# Patient Record
Sex: Male | Born: 1945 | Race: White | Hispanic: No | Marital: Married | Smoking: Former smoker
Health system: Southern US, Community
[De-identification: ages and names within clinical notes are randomized; demographics above are authoritative.]

## PROBLEM LIST (undated history)

## (undated) DIAGNOSIS — N138 Other obstructive and reflux uropathy: Secondary | ICD-10-CM

## (undated) DIAGNOSIS — I10 Essential (primary) hypertension: Secondary | ICD-10-CM

## (undated) DIAGNOSIS — A088 Other specified intestinal infections: Secondary | ICD-10-CM

## (undated) DIAGNOSIS — M170 Bilateral primary osteoarthritis of knee: Secondary | ICD-10-CM

## (undated) DIAGNOSIS — N401 Enlarged prostate with lower urinary tract symptoms: Secondary | ICD-10-CM

## (undated) DIAGNOSIS — R7402 Elevation of levels of lactic acid dehydrogenase (LDH): Secondary | ICD-10-CM

## (undated) DIAGNOSIS — I714 Abdominal aortic aneurysm, without rupture, unspecified: Secondary | ICD-10-CM

## (undated) DIAGNOSIS — G4762 Sleep related leg cramps: Secondary | ICD-10-CM

## (undated) DIAGNOSIS — R739 Hyperglycemia, unspecified: Secondary | ICD-10-CM

## (undated) DIAGNOSIS — K52832 Lymphocytic colitis: Secondary | ICD-10-CM

## (undated) DIAGNOSIS — C801 Malignant (primary) neoplasm, unspecified: Secondary | ICD-10-CM

## (undated) DIAGNOSIS — R7303 Prediabetes: Secondary | ICD-10-CM

## (undated) DIAGNOSIS — M1A0720 Idiopathic chronic gout, left ankle and foot, without tophus (tophi): Secondary | ICD-10-CM

## (undated) DIAGNOSIS — C61 Malignant neoplasm of prostate: Secondary | ICD-10-CM

## (undated) DIAGNOSIS — K760 Fatty (change of) liver, not elsewhere classified: Secondary | ICD-10-CM

## (undated) DIAGNOSIS — R300 Dysuria: Secondary | ICD-10-CM

## (undated) DIAGNOSIS — R740 Nonspecific elevation of levels of transaminase and lactic acid dehydrogenase [LDH]: Secondary | ICD-10-CM

## (undated) DIAGNOSIS — R7401 Elevation of levels of liver transaminase levels: Secondary | ICD-10-CM

## (undated) DIAGNOSIS — K5792 Diverticulitis of intestine, part unspecified, without perforation or abscess without bleeding: Secondary | ICD-10-CM

## (undated) DIAGNOSIS — E785 Hyperlipidemia, unspecified: Secondary | ICD-10-CM

## (undated) DIAGNOSIS — N529 Male erectile dysfunction, unspecified: Secondary | ICD-10-CM

## (undated) DIAGNOSIS — M109 Gout, unspecified: Secondary | ICD-10-CM

## (undated) HISTORY — DX: Dysuria: R30.0

## (undated) HISTORY — DX: Lymphocytic colitis: K52.832

## (undated) HISTORY — DX: Elevation of levels of liver transaminase levels: R74.01

## (undated) HISTORY — DX: Diverticulitis of intestine, part unspecified, without perforation or abscess without bleeding: K57.92

## (undated) HISTORY — PX: COLONOSCOPY: SHX174

## (undated) HISTORY — DX: Abdominal aortic aneurysm, without rupture: I71.4

## (undated) HISTORY — DX: Essential (primary) hypertension: I10

## (undated) HISTORY — DX: Benign prostatic hyperplasia with lower urinary tract symptoms: N40.1

## (undated) HISTORY — DX: Hyperlipidemia, unspecified: E78.5

## (undated) HISTORY — DX: Elevation of levels of lactic acid dehydrogenase (LDH): R74.02

## (undated) HISTORY — DX: Other specified intestinal infections: A08.8

## (undated) HISTORY — DX: Prediabetes: R73.03

## (undated) HISTORY — DX: Idiopathic chronic gout, left ankle and foot, without tophus (tophi): M1A.0720

## (undated) HISTORY — DX: Gout, unspecified: M10.9

## (undated) HISTORY — DX: Hyperglycemia, unspecified: R73.9

## (undated) HISTORY — PX: KNEE SURGERY: SHX244

## (undated) HISTORY — DX: Nonspecific elevation of levels of transaminase and lactic acid dehydrogenase (ldh): R74.0

## (undated) HISTORY — DX: Male erectile dysfunction, unspecified: N52.9

## (undated) HISTORY — DX: Bilateral primary osteoarthritis of knee: M17.0

## (undated) HISTORY — DX: Malignant neoplasm of prostate: C61

## (undated) HISTORY — DX: Malignant (primary) neoplasm, unspecified: C80.1

## (undated) HISTORY — PX: VASECTOMY: SHX75

## (undated) HISTORY — DX: Fatty (change of) liver, not elsewhere classified: K76.0

## (undated) HISTORY — PX: TOOTH EXTRACTION: SUR596

## (undated) HISTORY — PX: APPENDECTOMY: SHX54

## (undated) HISTORY — DX: Sleep related leg cramps: G47.62

## (undated) HISTORY — DX: Abdominal aortic aneurysm, without rupture, unspecified: I71.40

## (undated) HISTORY — PX: EYE SURGERY: SHX253

## (undated) HISTORY — DX: Other obstructive and reflux uropathy: N13.8

---

## 2014-08-28 DIAGNOSIS — M25561 Pain in right knee: Secondary | ICD-10-CM | POA: Insufficient documentation

## 2015-08-05 DIAGNOSIS — R739 Hyperglycemia, unspecified: Secondary | ICD-10-CM | POA: Insufficient documentation

## 2015-08-05 DIAGNOSIS — N138 Other obstructive and reflux uropathy: Secondary | ICD-10-CM | POA: Insufficient documentation

## 2015-08-05 DIAGNOSIS — N401 Enlarged prostate with lower urinary tract symptoms: Secondary | ICD-10-CM

## 2015-08-05 DIAGNOSIS — I10 Essential (primary) hypertension: Secondary | ICD-10-CM | POA: Insufficient documentation

## 2015-08-05 DIAGNOSIS — I714 Abdominal aortic aneurysm, without rupture, unspecified: Secondary | ICD-10-CM | POA: Insufficient documentation

## 2015-08-05 DIAGNOSIS — M1A0720 Idiopathic chronic gout, left ankle and foot, without tophus (tophi): Secondary | ICD-10-CM | POA: Insufficient documentation

## 2015-08-05 DIAGNOSIS — K760 Fatty (change of) liver, not elsewhere classified: Secondary | ICD-10-CM | POA: Insufficient documentation

## 2015-08-05 DIAGNOSIS — E782 Mixed hyperlipidemia: Secondary | ICD-10-CM | POA: Insufficient documentation

## 2016-08-11 DIAGNOSIS — K635 Polyp of colon: Secondary | ICD-10-CM | POA: Insufficient documentation

## 2017-03-07 DIAGNOSIS — M25461 Effusion, right knee: Secondary | ICD-10-CM | POA: Insufficient documentation

## 2017-03-07 DIAGNOSIS — M25462 Effusion, left knee: Secondary | ICD-10-CM

## 2017-08-24 DIAGNOSIS — K52832 Lymphocytic colitis: Secondary | ICD-10-CM | POA: Insufficient documentation

## 2017-09-06 ENCOUNTER — Encounter: Payer: Self-pay | Admitting: Surgery

## 2017-10-15 ENCOUNTER — Other Ambulatory Visit: Payer: Self-pay

## 2017-10-15 ENCOUNTER — Ambulatory Visit (INDEPENDENT_AMBULATORY_CARE_PROVIDER_SITE_OTHER): Payer: Medicare Other | Admitting: Surgery

## 2017-10-15 ENCOUNTER — Encounter: Payer: Self-pay | Admitting: Surgery

## 2017-10-15 VITALS — BP 139/71 | HR 75 | Temp 98.5°F | Resp 18 | Ht 69.0 in | Wt 195.0 lb

## 2017-10-15 DIAGNOSIS — I714 Abdominal aortic aneurysm, without rupture, unspecified: Secondary | ICD-10-CM

## 2017-10-15 NOTE — Progress Notes (Signed)
Vascular and Vein Specialist of [facility]  Patient name: Trevor Mason MRN: [id number] DOB: 1946-04-11 Sex: male   REQUESTING PROVIDER:    Dr. Daryel Gerald   REASON FOR CONSULT:    Establish vascular care  HISTORY OF PRESENT ILLNESS:   Trevor Mason is a 72 y.o. male, who is referred today to establish vascular care.  He has been followed for an abdominal aortic aneurysm and [place] for many years.  This was first discovered on MRI when looking for back pain etiologies many many years ago.  His most recent ultrasound revealed a 4.61 cm infrarenal abdominal aortic aneurysm.  He denies any abdominal pain.  He has not had any vascular interventions performed in the past.  The patient is suffering from colitis which required steroids for improvement.  He also has severe knee osteoarthritis and is waiting for knee replacement.  He is a former smoker.  He takes a statin for hypercholesterolemia.  He is on ACE inhibitor for hypertension.  PAST MEDICAL HISTORY    Past Medical History:  Diagnosis Date  . AAA (abdominal aortic aneurysm) ([place])   . BPH with urinary obstruction    Does not need a alpha blocker yet  . Chronic idiopathic gout involving toe of left foot without tophus    On Allopurinol  . Diverticulitis   . Dysuria   . Fatty liver   . Gouty arthropathy   . Hyperglycemia    Prediabetic range A1C  . Hyperlipidemia    Mixed.  On Statin.  Marland Kitchen Hypertension    Essential  . Impotence of organic origin   . Lymphocytic colitis    Currently off treatment.  Followed by GI.  Marland Kitchen Nocturnal leg cramps   . Nonspecific elevation of levels of transaminase and lactic acid dehydrogenase (LDH)   . Other specified intestinal infections   . Prediabetes   . Primary osteoarthritis of both knees      FAMILY HISTORY   History reviewed. No pertinent family history.  SOCIAL HISTORY:   Social History   Socioeconomic History  . Marital status: Married      Spouse name: Not on file  . Number of children: Not on file  . Years of education: Not on file  . Highest education level: Not on file  Occupational History  . Not on file  Social Needs  . Financial resource strain: Not on file  . Food insecurity:    Worry: Not on file    Inability: Not on file  . Transportation needs:    Medical: Not on file    Non-medical: Not on file  Tobacco Use  . Smoking status: Former Smoker    Types: Cigars    Last attempt to quit: 08/25/1998    Years since quitting: 19.1  . Smokeless tobacco: Never Used  Substance and Sexual Activity  . Alcohol use: Not Currently  . Drug use: Never  . Sexual activity: Not Currently  Lifestyle  . Physical activity:    Days per week: Not on file    Minutes per session: Not on file  . Stress: Not on file  Relationships  . Social connections:    Talks on phone: Not on file    Gets together: Not on file    Attends religious service: Not on file    Active member of club or organization: Not on file    Attends meetings of clubs or organizations: Not on file    Relationship status: Not on file  .  Intimate partner violence:    Fear of current or ex partner: Not on file    Emotionally abused: Not on file    Physically abused: Not on file    Forced sexual activity: Not on file  Other Topics Concern  . Not on file  Social History Narrative  . Not on file    ALLERGIES:    No Known Allergies  CURRENT MEDICATIONS:    Current Outpatient Medications  Medication Sig Dispense Refill  . allopurinol (ZYLOPRIM) 300 MG tablet Take 300 mg by mouth daily.    . budesonide (ENTOCORT EC) 3 MG 24 hr capsule Take 9 mg by mouth daily.    Marland Kitchen lisinopril (PRINIVIL,ZESTRIL) 20 MG tablet Take 20 mg by mouth daily.    . meloxicam (MOBIC) 15 MG tablet Take 15 mg by mouth daily.    . simvastatin (ZOCOR) 40 MG tablet Take 40 mg by mouth daily.    Marland Kitchen terazosin (HYTRIN) 1 MG capsule Take 1 mg by mouth at bedtime.    . vitamin B-12  (CYANOCOBALAMIN) 1000 MCG tablet Take 1,000 mcg by mouth daily.    . vitamin E 400 UNIT capsule Take 400 Units by mouth daily.     No current facility-administered medications for this visit.     REVIEW OF SYSTEMS:   [X]  denotes positive finding, [ ]  denotes negative finding Cardiac  Comments:  Chest pain or chest pressure:    Shortness of breath upon exertion:    Short of breath when lying flat:    Irregular heart rhythm:        Vascular    Pain in calf, thigh, or hip brought on by ambulation:    Pain in feet at night that wakes you up from your sleep:     Blood clot in your veins:    Leg swelling:  x       Pulmonary    Oxygen at home:    Productive cough:     Wheezing:         Neurologic    Sudden weakness in arms or legs:     Sudden numbness in arms or legs:     Sudden onset of difficulty speaking or slurred speech:    Temporary loss of vision in one eye:     Problems with dizziness:         Gastrointestinal    Blood in stool:      Vomited blood:         Genitourinary    Burning when urinating:     Blood in urine:        Psychiatric    Major depression:         Hematologic    Bleeding problems:    Problems with blood clotting too easily:        Skin    Rashes or ulcers:        Constitutional    Fever or chills:     PHYSICAL EXAM:   Vitals:   10/15/17 1424  BP: 139/71  Pulse: 75  Resp: 18  Temp: 98.5 F (36.9 C)  TempSrc: Oral  SpO2: 97%  Weight: 195 lb (88.5 kg)  Height: 5\' 9"  (1.753 m)    GENERAL: The patient is a well-nourished male, in no acute distress. The vital signs are documented above. CARDIAC: There is a regular rate and rhythm.  VASCULAR: Palpable bilateral pedal pulses.  No carotid bruits PULMONARY: Nonlabored respirations ABDOMEN: Soft and non-tender.  Pulsatile  mass which is not tender MUSCULOSKELETAL: There are no major deformities or cyanosis. NEUROLOGIC: No focal weakness or paresthesias are detected. SKIN: There are no  ulcers or rashes noted. PSYCHIATRIC: The patient has a normal affect.  STUDIES:   I have reviewed his outside ultrasound which shows an aortic diameter measuring 4.61 cm  ASSESSMENT and PLAN   AAA: The patient has had a significant amount of growth in his aneurysm over the course of 1 year (0.7 cm) he does not meet criteria for repair however I would like to get a CT angiogram to determine the true size of his aneurysm and to see if he is a candidate for endovascular repair.  This is been scheduled for July 2019.  This will be a CT angiogram of the chest abdomen and pelvis.  He will follow-up with me after this is completed.   Annamarie Major, MD Vascular and Vein Specialists of [facility] [phone] Pager [phone]

## 2017-10-20 ENCOUNTER — Encounter: Payer: Self-pay | Admitting: Vascular Surgery

## 2017-10-26 ENCOUNTER — Other Ambulatory Visit: Payer: Self-pay

## 2017-10-26 DIAGNOSIS — I714 Abdominal aortic aneurysm, without rupture, unspecified: Secondary | ICD-10-CM

## 2017-10-26 DIAGNOSIS — I713 Abdominal aortic aneurysm, ruptured, unspecified: Secondary | ICD-10-CM

## 2017-10-29 DIAGNOSIS — R972 Elevated prostate specific antigen [PSA]: Secondary | ICD-10-CM | POA: Insufficient documentation

## 2017-11-19 DIAGNOSIS — C61 Malignant neoplasm of prostate: Secondary | ICD-10-CM | POA: Insufficient documentation

## 2017-11-29 ENCOUNTER — Other Ambulatory Visit: Payer: Self-pay

## 2017-11-29 ENCOUNTER — Ambulatory Visit
Admission: RE | Admit: 2017-11-29 | Discharge: 2017-11-29 | Disposition: A | Discharge status: Home / Self Care | Payer: Medicare Other | Source: Ambulatory Visit | Attending: Surgery | Admitting: Surgery

## 2017-11-29 ENCOUNTER — Encounter: Payer: Self-pay | Admitting: Surgery

## 2017-11-29 ENCOUNTER — Ambulatory Visit (INDEPENDENT_AMBULATORY_CARE_PROVIDER_SITE_OTHER): Payer: Medicare Other | Admitting: Surgery

## 2017-11-29 VITALS — BP 122/76 | HR 66 | Temp 97.9°F | Resp 16 | Ht 69.0 in | Wt 189.0 lb

## 2017-11-29 DIAGNOSIS — I713 Abdominal aortic aneurysm, ruptured, unspecified: Secondary | ICD-10-CM

## 2017-11-29 DIAGNOSIS — I714 Abdominal aortic aneurysm, without rupture, unspecified: Secondary | ICD-10-CM

## 2017-11-29 MED ORDER — IOPAMIDOL (ISOVUE-370) INJECTION 76%
75.0000 mL | Freq: Once | INTRAVENOUS | Status: AC | PRN
  Administered 2017-11-29: 75 mL via INTRAVENOUS

## 2017-11-29 NOTE — Progress Notes (Signed)
Vascular and Vein Specialist of [facility]  Patient name: Trevor Mason MRN: [id number] DOB: [birth year] Sex: male   REASON FOR VISIT:    Follow-up AAA  HISOTRY OF PRESENT ILLNESS:    Trevor Mason is a 72 y.o. male who returns today for further evaluation of his abdominal aortic aneurysm.  This was first discovered on MRI when looking for etiology of back pain many years ago.  It has been followed in [facility].  He has not had any vascular interventions in the past.  The patient takes a statin for hypercholesterolemia.  He is on ACE inhibitor for hypertension.  He is a former smoker.  He does suffer from colitis and osteoarthritis of his knee.  He has recently been diagnosed with prostate cancer and is undergoing work-up for this.  PAST MEDICAL HISTORY:   Past Medical History:  Diagnosis Date  . AAA (abdominal aortic aneurysm) ([place])   . BPH with urinary obstruction    Does not need a alpha blocker yet  . Chronic idiopathic gout involving toe of left foot without tophus    On Allopurinol  . Diverticulitis   . Dysuria   . Fatty liver   . Gouty arthropathy   . Hyperglycemia    Prediabetic range A1C  . Hyperlipidemia    Mixed.  On Statin.  Marland Kitchen Hypertension    Essential  . Impotence of organic origin   . Lymphocytic colitis    Currently off treatment.  Followed by GI.  Marland Kitchen Nocturnal leg cramps   . Nonspecific elevation of levels of transaminase and lactic acid dehydrogenase (LDH)   . Other specified intestinal infections   . Prediabetes   . Primary osteoarthritis of both knees      FAMILY HISTORY:   No family history on file.  SOCIAL HISTORY:   Social History   Tobacco Use  . Smoking status: Former Smoker    Types: Cigars    Last attempt to quit: 08/25/1998    Years since quitting: 19.2  . Smokeless tobacco: Never Used  Substance Use Topics  . Alcohol use: Not Currently     ALLERGIES:   No Known Allergies   CURRENT  MEDICATIONS:   Current Outpatient Medications  Medication Sig Dispense Refill  . allopurinol (ZYLOPRIM) 300 MG tablet Take 300 mg by mouth daily.    . budesonide (ENTOCORT EC) 3 MG 24 hr capsule Take 9 mg by mouth daily.    Marland Kitchen lisinopril (PRINIVIL,ZESTRIL) 20 MG tablet Take 20 mg by mouth daily.    . meloxicam (MOBIC) 15 MG tablet Take 15 mg by mouth daily.    . simvastatin (ZOCOR) 40 MG tablet Take 40 mg by mouth daily.    Marland Kitchen terazosin (HYTRIN) 1 MG capsule Take 1 mg by mouth at bedtime.    . vitamin B-12 (CYANOCOBALAMIN) 1000 MCG tablet Take 1,000 mcg by mouth daily.    . vitamin E 400 UNIT capsule Take 400 Units by mouth daily.     No current facility-administered medications for this visit.     REVIEW OF SYSTEMS:   [X]  denotes positive finding, [ ]  denotes negative finding Cardiac  Comments:  Chest pain or chest pressure:    Shortness of breath upon exertion:    Short of breath when lying flat:    Irregular heart rhythm:        Vascular    Pain in calf, thigh, or hip brought on by ambulation:    Pain in feet at night that  wakes you up from your sleep:     Blood clot in your veins:    Leg swelling:         Pulmonary    Oxygen at home:    Productive cough:     Wheezing:         Neurologic    Sudden weakness in arms or legs:     Sudden numbness in arms or legs:     Sudden onset of difficulty speaking or slurred speech:    Temporary loss of vision in one eye:     Problems with dizziness:         Gastrointestinal    Blood in stool:     Vomited blood:         Genitourinary    Burning when urinating:     Blood in urine:        Psychiatric    Major depression:         Hematologic    Bleeding problems:    Problems with blood clotting too easily:        Skin    Rashes or ulcers:        Constitutional    Fever or chills:      PHYSICAL EXAM:   Vitals:   11/29/17 1132  BP: 122/76  Pulse: 66  Resp: 16  Temp: 97.9 F (36.6 C)  TempSrc: Oral  SpO2: 96%    Weight: 189 lb (85.7 kg)  Height: 5\' 9"  (1.753 m)    GENERAL: The patient is a well-nourished male, in no acute distress. The vital signs are documented above. CARDIAC: There is a regular rate and rhythm.  VASCULAR: No carotid bruits.  Palpable pedal pulses PULMONARY: Non-labored respirations ABDOMEN: Soft and non-tender with normal pitched bowel sounds.  MUSCULOSKELETAL: There are no major deformities or cyanosis. NEUROLOGIC: No focal weakness or paresthesias are detected. SKIN: There are no ulcers or rashes noted. PSYCHIATRIC: The patient has a normal affect.  STUDIES:   I have reviewed his CT angiogram.  It has not been formally read by radiology.  By my measurements the maximum diameter is 4.8 cm  MEDICAL ISSUES:   AAA: I feel that the patient's aneurysm remains less than 5 cm.  Therefore, I would not recommend repair at this time.  In addition, he is undergoing work-up for newly diagnosed prostate cancer.  We have agreed to have him follow-up in 6 months with a repeat CT scan.  When he returns to see me I will also get preoperative carotid duplex as well as lower extremity evaluation.  He understands that he has severe abdominal or back pain to go to the emergency department.    Annamarie Major, MD Vascular and Vein Specialists of [facility] [phone] Pager [phone]

## 2017-12-06 ENCOUNTER — Other Ambulatory Visit (HOSPITAL_COMMUNITY): Payer: Self-pay | Admitting: Urology

## 2017-12-06 ENCOUNTER — Other Ambulatory Visit: Payer: Self-pay

## 2017-12-06 DIAGNOSIS — I739 Peripheral vascular disease, unspecified: Secondary | ICD-10-CM

## 2017-12-06 DIAGNOSIS — I714 Abdominal aortic aneurysm, without rupture, unspecified: Secondary | ICD-10-CM

## 2017-12-06 DIAGNOSIS — C61 Malignant neoplasm of prostate: Secondary | ICD-10-CM

## 2017-12-06 DIAGNOSIS — I6523 Occlusion and stenosis of bilateral carotid arteries: Secondary | ICD-10-CM

## 2017-12-08 ENCOUNTER — Ambulatory Visit (HOSPITAL_COMMUNITY)
Admission: RE | Admit: 2017-12-08 | Discharge: 2017-12-08 | Disposition: A | Discharge status: Home / Self Care | Payer: Medicare Other | Source: Ambulatory Visit | Attending: Urology | Admitting: Urology

## 2017-12-08 DIAGNOSIS — K5730 Diverticulosis of large intestine without perforation or abscess without bleeding: Secondary | ICD-10-CM | POA: Diagnosis not present

## 2017-12-08 DIAGNOSIS — C61 Malignant neoplasm of prostate: Secondary | ICD-10-CM | POA: Insufficient documentation

## 2017-12-08 MED ORDER — GADOBENATE DIMEGLUMINE 529 MG/ML IV SOLN
20.0000 mL | Freq: Once | INTRAVENOUS | Status: AC
  Administered 2017-12-08: 17 mL via INTRAVENOUS

## 2017-12-27 ENCOUNTER — Telehealth: Payer: Self-pay | Admitting: Surgery

## 2017-12-27 NOTE — Telephone Encounter (Signed)
I tried to contact the patient tonight regarding the final read on his CT scan which shows a mesenteric mass.  I left a message for hi to call our office.  I would like for hi to have a general surgery appointment to further evaluate this mass.

## 2018-01-10 ENCOUNTER — Encounter: Payer: Self-pay | Admitting: Surgery

## 2018-01-10 ENCOUNTER — Other Ambulatory Visit: Payer: Self-pay

## 2018-01-10 ENCOUNTER — Ambulatory Visit (INDEPENDENT_AMBULATORY_CARE_PROVIDER_SITE_OTHER): Payer: Medicare Other | Admitting: Surgery

## 2018-01-10 VITALS — BP 140/78 | HR 63 | Resp 20 | Ht 69.0 in | Wt 192.0 lb

## 2018-01-10 DIAGNOSIS — I714 Abdominal aortic aneurysm, without rupture, unspecified: Secondary | ICD-10-CM

## 2018-01-10 DIAGNOSIS — I6523 Occlusion and stenosis of bilateral carotid arteries: Secondary | ICD-10-CM

## 2018-01-10 NOTE — Progress Notes (Signed)
Vascular and Vein Specialist of [facility]  Patient name: Trevor Mason MRN: [id number] DOB: February 13, 1946 Sex: male   REASON FOR VISIT:    Follow up  HISOTRY OF PRESENT ILLNESS:    Trevor Mason is a 72 y.o. male who returns today for discussions of his CT scan.  I last saw him in early July 2019 for a 4.8 cm aneurysm.  He is in the process of getting radiation therapy for prostate cancer.  He was scheduled to follow-up in 6 months with a repeat CT scan.   PAST MEDICAL HISTORY:   Past Medical History:  Diagnosis Date  . AAA (abdominal aortic aneurysm) ([place])   . BPH with urinary obstruction    Does not need a alpha blocker yet  . Chronic idiopathic gout involving toe of left foot without tophus    On Allopurinol  . Diverticulitis   . Dysuria   . Fatty liver   . Gouty arthropathy   . Hyperglycemia    Prediabetic range A1C  . Hyperlipidemia    Mixed.  On Statin.  Marland Kitchen Hypertension    Essential  . Impotence of organic origin   . Lymphocytic colitis    Currently off treatment.  Followed by GI.  Marland Kitchen Nocturnal leg cramps   . Nonspecific elevation of levels of transaminase and lactic acid dehydrogenase (LDH)   . Other specified intestinal infections   . Prediabetes   . Primary osteoarthritis of both knees      FAMILY HISTORY:   History reviewed. No pertinent family history.  SOCIAL HISTORY:   Social History   Tobacco Use  . Smoking status: Former Smoker    Types: Cigars    Last attempt to quit: 08/25/1998    Years since quitting: 19.3  . Smokeless tobacco: Never Used  Substance Use Topics  . Alcohol use: Not Currently     ALLERGIES:   No Known Allergies   CURRENT MEDICATIONS:   Current Outpatient Medications  Medication Sig Dispense Refill  . allopurinol (ZYLOPRIM) 300 MG tablet Take 300 mg by mouth daily.    . budesonide (ENTOCORT EC) 3 MG 24 hr capsule Take 9 mg by mouth daily.    Marland Kitchen lisinopril (PRINIVIL,ZESTRIL) 20 MG  tablet Take 20 mg by mouth daily.    . meloxicam (MOBIC) 15 MG tablet Take 15 mg by mouth daily.    . simvastatin (ZOCOR) 40 MG tablet Take 40 mg by mouth daily.    Marland Kitchen terazosin (HYTRIN) 1 MG capsule Take 1 mg by mouth at bedtime.    . vitamin B-12 (CYANOCOBALAMIN) 1000 MCG tablet Take 1,000 mcg by mouth daily.    . vitamin E 400 UNIT capsule Take 400 Units by mouth daily.     No current facility-administered medications for this visit.     REVIEW OF SYSTEMS:   [X]  denotes positive finding, [ ]  denotes negative finding Cardiac  Comments:  Chest pain or chest pressure:    Shortness of breath upon exertion:    Short of breath when lying flat:    Irregular heart rhythm:        Vascular    Pain in calf, thigh, or hip brought on by ambulation:    Pain in feet at night that wakes you up from your sleep:     Blood clot in your veins:    Leg swelling:         Pulmonary    Oxygen at home:    Productive cough:  Wheezing:         Neurologic    Sudden weakness in arms or legs:     Sudden numbness in arms or legs:     Sudden onset of difficulty speaking or slurred speech:    Temporary loss of vision in one eye:     Problems with dizziness:         Gastrointestinal    Blood in stool:     Vomited blood:         Genitourinary    Burning when urinating:     Blood in urine:        Psychiatric    Major depression:         Hematologic    Bleeding problems:    Problems with blood clotting too easily:        Skin    Rashes or ulcers:        Constitutional    Fever or chills:      PHYSICAL EXAM:   Vitals:   01/10/18 0829  BP: 140/78  Pulse: 63  Resp: 20  SpO2: 98%  Weight: 192 lb (87.1 kg)  Height: 5\' 9"  (1.753 m)    GENERAL: The patient is a well-nourished male, in no acute distress. The vital signs are documented above. CARDIAC: There is a regular rate and rhythm.  PULMONARY: Non-labored respirations ABDOMEN: Soft and non-tender MUSCULOSKELETAL: There are no  major deformities or cyanosis. NEUROLOGIC: No focal weakness or paresthesias are detected. SKIN: There are no ulcers or rashes noted. PSYCHIATRIC: The patient has a normal affect.  STUDIES:   Vascular:  Infrarenal abdominal aortic aneurysm with a maximal diameter of 4.8 cm.  There are 3 left renal arteries.  No evidence of thoracic aortic aneurysm. No acute vascular pathology in the thorax.  Nonvascular:  T9 compression fracture of indeterminate age. This has a subacute appearance. There is erosion of the superior endplate and pathologic fracture is not excluded.  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 Differential diagnosis includes desmoid tumor, carcinoid tumor, or malignancy. There are associated small satellite nodules or small lymph nodes.  MEDICAL ISSUES:   AAA: Follow-up 6 months with repeat CT scan  Mesenteric mass: Patient was given a copy of the CT scan report which he is going to take to his radiologist/urologist to determine the next step for work-up.  He is scheduled to see them in a few days.    Annamarie Major, MD Vascular and Vein Specialists of [facility] [phone] Pager [phone]

## 2018-01-30 HISTORY — PX: EXCISION OF ABDOMINAL WALL TUMOR: SHX6687

## 2018-02-01 DIAGNOSIS — M542 Cervicalgia: Secondary | ICD-10-CM | POA: Insufficient documentation

## 2018-04-06 ENCOUNTER — Other Ambulatory Visit: Payer: Self-pay

## 2018-06-02 ENCOUNTER — Ambulatory Visit
Admission: RE | Admit: 2018-06-02 | Discharge: 2018-06-02 | Disposition: A | Discharge status: Home / Self Care | Payer: Medicare Other | Source: Ambulatory Visit | Attending: Surgery | Admitting: Surgery

## 2018-06-02 DIAGNOSIS — I714 Abdominal aortic aneurysm, without rupture, unspecified: Secondary | ICD-10-CM

## 2018-06-02 MED ORDER — IOPAMIDOL (ISOVUE-370) INJECTION 76%
75.0000 mL | Freq: Once | INTRAVENOUS | Status: AC | PRN
  Administered 2018-06-02: 75 mL via INTRAVENOUS

## 2018-06-06 ENCOUNTER — Encounter: Payer: Self-pay | Admitting: Surgery

## 2018-06-06 ENCOUNTER — Ambulatory Visit (INDEPENDENT_AMBULATORY_CARE_PROVIDER_SITE_OTHER)
Admission: RE | Admit: 2018-06-06 | Discharge: 2018-06-06 | Disposition: A | Discharge status: Home / Self Care | Payer: Medicare Other | Source: Ambulatory Visit | Attending: Surgery | Admitting: Surgery

## 2018-06-06 ENCOUNTER — Other Ambulatory Visit: Payer: Self-pay

## 2018-06-06 ENCOUNTER — Ambulatory Visit (HOSPITAL_COMMUNITY)
Admission: RE | Admit: 2018-06-06 | Discharge: 2018-06-06 | Disposition: A | Discharge status: Home / Self Care | Payer: Medicare Other | Source: Ambulatory Visit | Attending: Surgery | Admitting: Surgery

## 2018-06-06 ENCOUNTER — Ambulatory Visit (INDEPENDENT_AMBULATORY_CARE_PROVIDER_SITE_OTHER): Payer: Medicare Other | Admitting: Surgery

## 2018-06-06 VITALS — BP 121/76 | HR 68 | Temp 98.0°F | Resp 20 | Ht 69.0 in | Wt 192.0 lb

## 2018-06-06 DIAGNOSIS — I714 Abdominal aortic aneurysm, without rupture, unspecified: Secondary | ICD-10-CM

## 2018-06-06 DIAGNOSIS — I739 Peripheral vascular disease, unspecified: Secondary | ICD-10-CM | POA: Diagnosis not present

## 2018-06-06 DIAGNOSIS — I6523 Occlusion and stenosis of bilateral carotid arteries: Secondary | ICD-10-CM | POA: Diagnosis not present

## 2018-06-06 NOTE — Progress Notes (Signed)
Vascular and Vein Specialist of [facility]  Patient name: Trevor Mason MRN: [id number] DOB: [birth year] Sex: male   REASON FOR VISIT:    Follow up AAA  HISOTRY OF PRESENT ILLNESS:   Trevor Mason is a 73 y.o. male who returns today for further evaluation of his abdominal aortic aneurysm.  This was first discovered on MRI when looking for etiology of back pain many years ago.  It has been [facility] in [facility].  He has not had any vascular interventions in the past.  The patient takes a statin for hypercholesterolemia.  He is on ACE inhibitor for hypertension.  He is a former smoker.  He does suffer from colitis and osteoarthritis of his knee.  He has recently completed radiation therapy for prostate cancer.  Also since her last visit he underwent partial bowel resection which turned out benign.    PAST MEDICAL HISTORY:   Past Medical History:  Diagnosis Date  . AAA (abdominal aortic aneurysm) ([place])   . BPH with urinary obstruction    Does not need a alpha blocker yet  . Chronic idiopathic gout involving toe of left foot without tophus    On Allopurinol  . Diverticulitis   . Dysuria   . Fatty liver   . Gouty arthropathy   . Hyperglycemia    Prediabetic range A1C  . Hyperlipidemia    Mixed.  On Statin.  Marland Kitchen Hypertension    Essential  . Impotence of organic origin   . Lymphocytic colitis    Currently off treatment.  Followed by GI.  Marland Kitchen Nocturnal leg cramps   . Nonspecific elevation of levels of transaminase and lactic acid dehydrogenase (LDH)   . Other specified intestinal infections   . Prediabetes   . Primary osteoarthritis of both knees      FAMILY HISTORY:   History reviewed. No pertinent family history.  SOCIAL HISTORY:   Social History   Tobacco Use  . Smoking status: Former Smoker    Types: Cigars    Last attempt to quit: 08/25/1998    Years since quitting: 19.7  . Smokeless tobacco: Never Used  Substance Use  Topics  . Alcohol use: Not Currently     ALLERGIES:   No Known Allergies   CURRENT MEDICATIONS:   Current Outpatient Medications  Medication Sig Dispense Refill  . allopurinol (ZYLOPRIM) 300 MG tablet Take 300 mg by mouth daily.    . budesonide (ENTOCORT EC) 3 MG 24 hr capsule Take 9 mg by mouth daily.    Marland Kitchen lisinopril (PRINIVIL,ZESTRIL) 20 MG tablet Take 20 mg by mouth daily.    . meloxicam (MOBIC) 15 MG tablet Take 15 mg by mouth daily.    . simvastatin (ZOCOR) 40 MG tablet Take 40 mg by mouth daily.    Marland Kitchen terazosin (HYTRIN) 1 MG capsule Take 1 mg by mouth at bedtime.    . vitamin B-12 (CYANOCOBALAMIN) 1000 MCG tablet Take 1,000 mcg by mouth daily.    . vitamin E 400 UNIT capsule Take 400 Units by mouth daily.     No current facility-administered medications for this visit.     REVIEW OF SYSTEMS:   [X]  denotes positive finding, [ ]  denotes negative finding Cardiac  Comments:  Chest pain or chest pressure:    Shortness of breath upon exertion:    Short of breath when lying flat:    Irregular heart rhythm:        Vascular    Pain in calf, thigh, or hip  brought on by ambulation:    Pain in feet at night that wakes you up from your sleep:     Blood clot in your veins:    Leg swelling:         Pulmonary    Oxygen at home:    Productive cough:     Wheezing:         Neurologic    Sudden weakness in arms or legs:     Sudden numbness in arms or legs:     Sudden onset of difficulty speaking or slurred speech:    Temporary loss of vision in one eye:     Problems with dizziness:         Gastrointestinal    Blood in stool:     Vomited blood:         Genitourinary    Burning when urinating:     Blood in urine:        Psychiatric    Major depression:         Hematologic    Bleeding problems:    Problems with blood clotting too easily:        Skin    Rashes or ulcers:        Constitutional    Fever or chills:      PHYSICAL EXAM:   Vitals:   06/06/18 1034    BP: 121/76  Pulse: 68  Resp: 20  Temp: 98 F (36.7 C)  SpO2: 99%  Weight: 192 lb (87.1 kg)  Height: 5\' 9"  (1.753 m)    GENERAL: The patient is a well-nourished male, in no acute distress. The vital signs are documented above. CARDIAC: There is a regular rate and rhythm.  VASCULAR: no bruits PULMONARY: Non-labored respirations ABDOMEN: Soft and non-tender  MUSCULOSKELETAL: There are no major deformities or cyanosis. NEUROLOGIC: No focal weakness or paresthesias are detected. SKIN: There are no ulcers or rashes noted. PSYCHIATRIC: The patient has a normal affect.  STUDIES:   I have reviewed his CTA with the following findings:  Redemonstration of infrarenal abdominal aortic aneurysm which is essentially unchanged from the most recent CT, measuring approximately 4.9 cm.  VAscular lab studies: Carotid duplex: Right= 1-39%, left= 40-59%  ABI Findings: +---------+------------------+-----+---------+--------+ Right    Rt Pressure (mmHg)IndexWaveform Comment  +---------+------------------+-----+---------+--------+ Brachial 126                                      +---------+------------------+-----+---------+--------+ ATA      255               1.96 triphasic         +---------+------------------+-----+---------+--------+ PTA      255               1.96 triphasic         +---------+------------------+-----+---------+--------+ Great Toe100               0.77                   +---------+------------------+-----+---------+--------+  +---------+------------------+-----+---------+-------+ Left     Lt Pressure (mmHg)IndexWaveform Comment +---------+------------------+-----+---------+-------+ Brachial 130                                     +---------+------------------+-----+---------+-------+ ATA      255  1.96 triphasic        +---------+------------------+-----+---------+-------+ PTA      255               1.96  triphasic        +---------+------------------+-----+---------+-------+ Great Toe121               0.93                  +---------+------------------+-----+---------+-------+  Lower extremity duplex: Right: Normal examination. No evidence of arterial occlusive disease. No obvious common femoral or popliteal artery aneurysm. Irregular, heterogenous plaque is seen on the common femoral arteries bilaterally.  Left: Normal examination. No evidence of arterial occlusive disease. No obvious common femoral or popliteal artery aneurysm. Irregular, heterogenous plaque is seen on the common femoral arteries bilaterally.   MEDICAL ISSUES:   AAA: We discussed proceeding with repair prior to his moving to [place] which is scheduled for April or May.  I am going to give him another 4 to 6 weeks to recover from his radiation therapy and a chance to see his urologist.  He will follow-up with me in 4 to 6 weeks as a preoperative visit to discuss endovascular repair of his aneurysm.    Annamarie Major, MD Vascular and Vein Specialists of [facility] [phone] Pager [phone]

## 2018-07-02 DIAGNOSIS — M7542 Impingement syndrome of left shoulder: Secondary | ICD-10-CM | POA: Insufficient documentation

## 2018-07-04 ENCOUNTER — Encounter: Payer: Self-pay | Admitting: *Deleted

## 2018-07-04 ENCOUNTER — Other Ambulatory Visit: Payer: Self-pay | Admitting: *Deleted

## 2018-07-04 ENCOUNTER — Other Ambulatory Visit: Payer: Self-pay

## 2018-07-04 ENCOUNTER — Encounter: Payer: Self-pay | Admitting: Surgery

## 2018-07-04 ENCOUNTER — Ambulatory Visit (INDEPENDENT_AMBULATORY_CARE_PROVIDER_SITE_OTHER): Payer: Medicare Other | Admitting: Surgery

## 2018-07-04 VITALS — BP 143/86 | HR 65 | Temp 97.4°F | Resp 18 | Ht 69.0 in | Wt 178.0 lb

## 2018-07-04 DIAGNOSIS — I6523 Occlusion and stenosis of bilateral carotid arteries: Secondary | ICD-10-CM

## 2018-07-04 DIAGNOSIS — I714 Abdominal aortic aneurysm, without rupture, unspecified: Secondary | ICD-10-CM

## 2018-07-04 NOTE — Progress Notes (Signed)
Vascular and Vein Specialist of [facility]  Patient name: Trevor Mason MRN: [id number] DOB: 07-Aug-1945 Sex: male   REASON FOR VISIT:    Follow up  HISOTRY OF PRESENT ILLNESS:    Trevor Mason a 73 y.o.malewho returns today for further evaluation of his abdominal aortic aneurysm. This was first discovered on MRI when looking for etiology of back pain many years ago. It has been followed in [facility]. He has not had any vascular interventions in the past.  The patient takes a statin for hypercholesterolemia. He is on ACE inhibitor for hypertension. He is a former smoker. He does suffer from colitis and osteoarthritis of his knee.  He is recently completed radiation therapy for prostate cancer and has also undergone a partial bowel resection for benign etiology  PAST MEDICAL HISTORY:   Past Medical History:  Diagnosis Date  . AAA (abdominal aortic aneurysm) ([place])   . BPH with urinary obstruction    Does not need a alpha blocker yet  . Chronic idiopathic gout involving toe of left foot without tophus    On Allopurinol  . Diverticulitis   . Dysuria   . Fatty liver   . Gouty arthropathy   . Hyperglycemia    Prediabetic range A1C  . Hyperlipidemia    Mixed.  On Statin.  Marland Kitchen Hypertension    Essential  . Impotence of organic origin   . Lymphocytic colitis    Currently off treatment.  Followed by GI.  Marland Kitchen Nocturnal leg cramps   . Nonspecific elevation of levels of transaminase and lactic acid dehydrogenase (LDH)   . Other specified intestinal infections   . Prediabetes   . Primary osteoarthritis of both knees      FAMILY HISTORY:   History reviewed. No pertinent family history.  SOCIAL HISTORY:   Social History   Tobacco Use  . Smoking status: Former Smoker    Types: Cigars    Last attempt to quit: 08/25/1998    Years since quitting: 19.8  . Smokeless tobacco: Never Used  Substance Use Topics  . Alcohol use: Not  Currently     ALLERGIES:   No Known Allergies   CURRENT MEDICATIONS:   Current Outpatient Medications  Medication Sig Dispense Refill  . allopurinol (ZYLOPRIM) 300 MG tablet Take 300 mg by mouth daily.    . budesonide (ENTOCORT EC) 3 MG 24 hr capsule Take 9 mg by mouth daily.    Marland Kitchen lisinopril (PRINIVIL,ZESTRIL) 20 MG tablet Take 20 mg by mouth daily.    . meloxicam (MOBIC) 15 MG tablet Take 15 mg by mouth daily.    . simvastatin (ZOCOR) 40 MG tablet Take 40 mg by mouth daily.    Marland Kitchen terazosin (HYTRIN) 1 MG capsule Take 1 mg by mouth at bedtime.    . vitamin B-12 (CYANOCOBALAMIN) 1000 MCG tablet Take 1,000 mcg by mouth daily.    . vitamin E 400 UNIT capsule Take 400 Units by mouth daily.     No current facility-administered medications for this visit.     REVIEW OF SYSTEMS:   [X]  denotes positive finding, [ ]  denotes negative finding Cardiac  Comments:  Chest pain or chest pressure:    Shortness of breath upon exertion:    Short of breath when lying flat:    Irregular heart rhythm:        Vascular    Pain in calf, thigh, or hip brought on by ambulation:    Pain in feet at night that wakes you  up from your sleep:     Blood clot in your veins:    Leg swelling:         Pulmonary    Oxygen at home:    Productive cough:     Wheezing:         Neurologic    Sudden weakness in arms or legs:     Sudden numbness in arms or legs:     Sudden onset of difficulty speaking or slurred speech:    Temporary loss of vision in one eye:     Problems with dizziness:         Gastrointestinal    Blood in stool:     Vomited blood:         Genitourinary    Burning when urinating:     Blood in urine:        Psychiatric    Major depression:         Hematologic    Bleeding problems:    Problems with blood clotting too easily:        Skin    Rashes or ulcers:        Constitutional    Fever or chills:      PHYSICAL EXAM:   Vitals:   07/04/18 1121  BP: (!) 143/86  Pulse: 65   Resp: 18  Temp: (!) 97.4 F (36.3 C)  TempSrc: Oral  SpO2: 98%  Weight: 178 lb (80.7 kg)  Height: 5\' 9"  (1.753 m)    GENERAL: The patient is a well-nourished male, in no acute distress. The vital signs are documented above. CARDIAC: There is a regular rate and rhythm.  VASCULAR: palpable pedal pulses PULMONARY: Non-labored respirations ABDOMEN: Soft and non-tender  no major deformities or cyanosis. NEUROLOGIC: No focal weakness or paresthesias are detected. SKIN: There are no ulcers or rashes noted. PSYCHIATRIC: The patient has a normal affect.  STUDIES:   none  MEDICAL ISSUES:   AAA:  5.0 cm infrarenal AAA.  We discussed the details of the proceedure including the risks and benefits which include but are not limited to the risk of infection, bleeding, distal embolization, intestinal ischemia, bleeding, death.  All of his questions were answered.  Have placed on the OR schedule for Friday, February 14.  I am sending him for cardiac clearance.  He will start taking a baby aspirin.    Annamarie Major, MD Vascular and Vein Specialists of [facility] [phone] Pager [phone]

## 2018-07-04 NOTE — H&P (View-Only) (Signed)
Vascular and Vein Specialist of [facility]  Patient name: Trevor Mason MRN: [id number] DOB: [birth year] Sex: male   REASON FOR VISIT:    Follow up  HISOTRY OF PRESENT ILLNESS:    Trevor Mason a 73 y.o.malewho returns today for further evaluation of his abdominal aortic aneurysm. This was first discovered on MRI when looking for etiology of back pain many years ago. It has been followed in [facility]. He has not had any vascular interventions in the past.  The patient takes a statin for hypercholesterolemia. He is on ACE inhibitor for hypertension. He is a former smoker. He does suffer from colitis and osteoarthritis of his knee.  He is recently completed radiation therapy for prostate cancer and has also undergone a partial bowel resection for benign etiology  PAST MEDICAL HISTORY:   Past Medical History:  Diagnosis Date  . AAA (abdominal aortic aneurysm) ([place])   . BPH with urinary obstruction    Does not need a alpha blocker yet  . Chronic idiopathic gout involving toe of left foot without tophus    On Allopurinol  . Diverticulitis   . Dysuria   . Fatty liver   . Gouty arthropathy   . Hyperglycemia    Prediabetic range A1C  . Hyperlipidemia    Mixed.  On Statin.  Marland Kitchen Hypertension    Essential  . Impotence of organic origin   . Lymphocytic colitis    Currently off treatment.  Followed by GI.  Marland Kitchen Nocturnal leg cramps   . Nonspecific elevation of levels of transaminase and lactic acid dehydrogenase (LDH)   . Other specified intestinal infections   . Prediabetes   . Primary osteoarthritis of both knees      FAMILY HISTORY:   History reviewed. No pertinent family history.  SOCIAL HISTORY:   Social History   Tobacco Use  . Smoking status: Former Smoker    Types: Cigars    Last attempt to quit: 08/25/1998    Years since quitting: 19.8  . Smokeless tobacco: Never Used  Substance Use Topics  . Alcohol use: Not  Currently     ALLERGIES:   No Known Allergies   CURRENT MEDICATIONS:   Current Outpatient Medications  Medication Sig Dispense Refill  . allopurinol (ZYLOPRIM) 300 MG tablet Take 300 mg by mouth daily.    . budesonide (ENTOCORT EC) 3 MG 24 hr capsule Take 9 mg by mouth daily.    Marland Kitchen lisinopril (PRINIVIL,ZESTRIL) 20 MG tablet Take 20 mg by mouth daily.    . meloxicam (MOBIC) 15 MG tablet Take 15 mg by mouth daily.    . simvastatin (ZOCOR) 40 MG tablet Take 40 mg by mouth daily.    Marland Kitchen terazosin (HYTRIN) 1 MG capsule Take 1 mg by mouth at bedtime.    . vitamin B-12 (CYANOCOBALAMIN) 1000 MCG tablet Take 1,000 mcg by mouth daily.    . vitamin E 400 UNIT capsule Take 400 Units by mouth daily.     No current facility-administered medications for this visit.     REVIEW OF SYSTEMS:   [X]  denotes positive finding, [ ]  denotes negative finding Cardiac  Comments:  Chest pain or chest pressure:    Shortness of breath upon exertion:    Short of breath when lying flat:    Irregular heart rhythm:        Vascular    Pain in calf, thigh, or hip brought on by ambulation:    Pain in feet at night that wakes you  up from your sleep:     Blood clot in your veins:    Leg swelling:         Pulmonary    Oxygen at home:    Productive cough:     Wheezing:         Neurologic    Sudden weakness in arms or legs:     Sudden numbness in arms or legs:     Sudden onset of difficulty speaking or slurred speech:    Temporary loss of vision in one eye:     Problems with dizziness:         Gastrointestinal    Blood in stool:     Vomited blood:         Genitourinary    Burning when urinating:     Blood in urine:        Psychiatric    Major depression:         Hematologic    Bleeding problems:    Problems with blood clotting too easily:        Skin    Rashes or ulcers:        Constitutional    Fever or chills:      PHYSICAL EXAM:   Vitals:   07/04/18 1121  BP: (!) 143/86  Pulse: 65   Resp: 18  Temp: (!) 97.4 F (36.3 C)  TempSrc: Oral  SpO2: 98%  Weight: 178 lb (80.7 kg)  Height: 5\' 9"  (1.753 m)    GENERAL: The patient is a well-nourished male, in no acute distress. The vital signs are documented above. CARDIAC: There is a regular rate and rhythm.  VASCULAR: palpable pedal pulses PULMONARY: Non-labored respirations ABDOMEN: Soft and non-tender  no major deformities or cyanosis. NEUROLOGIC: No focal weakness or paresthesias are detected. SKIN: There are no ulcers or rashes noted. PSYCHIATRIC: The patient has a normal affect.  STUDIES:   none  MEDICAL ISSUES:   AAA:  5.0 cm infrarenal AAA.  We discussed the details of the proceedure including the risks and benefits which include but are not limited to the risk of infection, bleeding, distal embolization, intestinal ischemia, bleeding, death.  All of his questions were answered.  Have placed on the OR schedule for Friday, February 14.  I am sending him for cardiac clearance.  He will start taking a baby aspirin.    Annamarie Major, MD Vascular and Vein Specialists of [facility] [phone] Pager [phone]

## 2018-07-06 ENCOUNTER — Ambulatory Visit (INDEPENDENT_AMBULATORY_CARE_PROVIDER_SITE_OTHER): Payer: Medicare Other | Admitting: Cardiology

## 2018-07-06 ENCOUNTER — Encounter: Payer: Self-pay | Admitting: Cardiology

## 2018-07-06 VITALS — BP 116/72 | HR 63 | Ht 69.0 in | Wt 181.0 lb

## 2018-07-06 DIAGNOSIS — E782 Mixed hyperlipidemia: Secondary | ICD-10-CM

## 2018-07-06 DIAGNOSIS — I6523 Occlusion and stenosis of bilateral carotid arteries: Secondary | ICD-10-CM | POA: Insufficient documentation

## 2018-07-06 DIAGNOSIS — I714 Abdominal aortic aneurysm, without rupture, unspecified: Secondary | ICD-10-CM

## 2018-07-06 DIAGNOSIS — I10 Essential (primary) hypertension: Secondary | ICD-10-CM

## 2018-07-06 DIAGNOSIS — Z01810 Encounter for preprocedural cardiovascular examination: Secondary | ICD-10-CM | POA: Insufficient documentation

## 2018-07-06 NOTE — Progress Notes (Signed)
Subjective:   @Patient  ID@: Trevor Mason, male    DOB: 07-07-1945, 73 y.o.   MRN: [id number]  Patient referred by Serafina Mitchell for preoperative cardiac risk stratificaiton.  Chief Complaint  Patient presents with  . New Patient (Initial Visit)    For Pre-Op clearance on 07/15/18    HPI   Patient with hypertension, hyperlipidemia, osteoarthritis, former tobacco use, fatty liver previously followed by GI, prostate cancer diagnosed in 2019 S/P radiation therapy and is now in remission, known infrarenal 5.0 cm abdominal aortic aneurysm measuring eferred to [place] for preoperative cardiac risk stratification by Dr. Annamarie Major.   Patient reports was diagnosed 20 years ago with aneurysm and has progressively enlarged over the last year. He denies any history of CAD.   He was working until August 2019 after having colonic tumor removal. He is planning on moving later this year. 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 He does admit to not doing any strenuous exercise after his surgery and also due to bilateral knee pain from arthritis. He does walk approximatley 5 mins on the treadmill. No chest pain or dyspnea on exertion.   Past Medical History:  Diagnosis Date  . AAA (abdominal aortic aneurysm) ([place])   . AAA (abdominal aortic aneurysm) ([place])   . BPH with urinary obstruction    Does not need a alpha blocker yet  . Cancer ([place])   . Chronic idiopathic gout involving toe of left foot without tophus    On Allopurinol  . Diverticulitis   . Dysuria   . Fatty liver   . Gouty arthropathy   . Hyperglycemia    Prediabetic range A1C  . Hyperlipidemia    Mixed.  On Statin.  Marland Kitchen Hypertension    Essential  . Impotence of organic origin   . Lymphocytic colitis    Currently off treatment.  Followed by GI.  Marland Kitchen Nocturnal leg cramps   . Nonspecific elevation of levels of transaminase and lactic acid dehydrogenase (LDH)   . Other specified intestinal  infections   . Prediabetes   . Primary osteoarthritis of both knees   . Prostate cancer [facility])     Past Surgical History:  Procedure Laterality Date  . COLONOSCOPY    . 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 EYE SURGERY    . KNEE SURGERY Bilateral   . TOOTH EXTRACTION    . VASECTOMY      Family History  Problem Relation Age of Onset  . Hypertension Mother   . Stroke Mother   . Brain cancer Father   . Hypertension Sister   . COPD Brother   . Diabetic kidney disease Sister     Social History   Socioeconomic History  . Marital status: Married    Spouse name: Not on file  . Number of children: 1  . Years of education: Not on file  . Highest education level: Not on file  Occupational History  . Not on file  Social Needs  . Financial resource strain: Not on file  . Food insecurity:    Worry: Not on file    Inability: Not on file  . Transportation needs:    Medical: Not on file    Non-medical: Not on file  Tobacco Use  . Smoking status: Former Smoker    Packs/day: 2.00    Years: 40.00    Pack years: 80.00  Types: Cigarettes    Last attempt to quit: 08/25/1998    Years since quitting: 19.8  . Smokeless tobacco: Never Used  Substance and Sexual Activity  . Alcohol use: Yes    Comment: couple drinks a week  . Drug use: Never  . Sexual activity: Not Currently  Lifestyle  . Physical activity:    Days per week: Not on file    Minutes per session: Not on file  . Stress: Not on file  Relationships  . Social connections:    Talks on phone: Not on file    Gets together: Not on file    Attends religious service: Not on file    Active member of club or organization: Not on file    Attends meetings of clubs or organizations: Not on file    Relationship status: Not on file  . Intimate partner violence:    Fear of current or ex partner: Not on file    Emotionally abused: Not on file    Physically abused: Not on file    Forced sexual  activity: Not on file  Other Topics Concern  . Not on file  Social History Narrative  . Not on file    Current Outpatient Medications on File Prior to Visit  Medication Sig Dispense Refill  . acetaminophen (TYLENOL) 650 MG CR tablet Take 1,300 mg by mouth every 8 (eight) hours as needed for pain.    Marland Kitchen allopurinol (ZYLOPRIM) 300 MG tablet Take 300 mg by mouth every evening.     Marland Kitchen aspirin EC 81 MG tablet Take 81 mg by mouth daily.    . budesonide (ENTOCORT EC) 3 MG 24 hr capsule Take 3 mg by mouth daily.     . Carboxymethylcellulose Sodium (LUBRICANT EYE DROPS OP) Place 1 drop into both eyes daily.    Marland Kitchen lisinopril (PRINIVIL,ZESTRIL) 20 MG tablet Take 20 mg by mouth daily.    . simvastatin (ZOCOR) 40 MG tablet Take 40 mg by mouth every evening.     . terazosin (HYTRIN) 1 MG capsule Take 1 mg by mouth at bedtime.    . vitamin B-12 (CYANOCOBALAMIN) 1000 MCG tablet Take 1,000 mcg by mouth daily.    . vitamin E 400 UNIT capsule Take 800 Units by mouth every evening.      No current facility-administered medications on file prior to visit.      Review of Systems  Constitution: Positive for decreased appetite (since radiation therapy). Negative for malaise/fatigue, weight gain and weight loss.  Eyes: Negative for visual disturbance.  Cardiovascular: Negative for chest pain, claudication, dyspnea on exertion, leg swelling and syncope.  Respiratory: Negative for hemoptysis and wheezing.   Endocrine: Negative for cold intolerance and heat intolerance.  Hematologic/Lymphatic: Negative for bleeding problem. Does not bruise/bleed easily.  Skin: Negative for nail changes.  Musculoskeletal: Positive for arthritis (bilateral knees). Negative for myalgias.  Gastrointestinal: Negative for abdominal pain, nausea and vomiting.  Neurological: Negative for difficulty with concentration, dizziness, focal weakness and headaches.  Psychiatric/Behavioral: Negative for altered mental status and suicidal ideas.   All other systems reviewed and are negative.      Objective:   Physical Exam  Constitutional: He is oriented to person, place, and time. He appears well-developed and well-nourished.  HENT:  Head: Normocephalic and atraumatic.  Neck: Normal range of motion. No thyromegaly present.  Cardiovascular: Normal rate, regular rhythm and normal heart sounds.  Pulses:      Carotid pulses are on the left side with  bruit.      Femoral pulses are 2+ on the right side and 2+ on the left side.      Popliteal pulses are 2+ on the right side and 2+ on the left side.       Dorsalis pedis pulses are 2+ on the right side and 2+ on the left side.       Posterior tibial pulses are 2+ on the right side and 2+ on the left side.  Pulmonary/Chest: Effort normal and breath sounds normal.  Abdominal: Soft. He exhibits no abdominal bruit. There is no abdominal tenderness.  Neurological: He is alert and oriented to person, place, and time.  Skin: Skin is warm, dry and intact.  Vitals reviewed.    CTA of ABD and Pelvis 06/02/2018:   Redemonstration of infrarenal abdominal aortic aneurysm which is essentially unchanged from the most recent CT, measuring approximately 4.9 cm. Aortic Atherosclerosis (ICD10-I70.0). Aortic aneurysm NOS (ICD10-I71.9).   Surgical changes of small bowel resection in the left abdomen, with associated mild edema and nodularity in the mesenteric fat. The previous 2.7 cm heterogeneous mass within the mesentery is significantly smaller. While the prior differential diagnosis provided on CT from 12/08/2017 (malignancy, carcinoid tumor, desmoid tumor, lymphoma) Still applies, the largest component has significantly decreased in size and is in fact nearly resolved, a benign feature which suggests postsurgical changes such as scarring/sclerosing mesenteritis, venous congestion, inflammation/edema are the more likely etiology.   Circumferential bladder wall thickening suggesting chronic  bladder outlet obstruction.   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  Carotid duplex 06/06/2018:   Right Carotid: Velocities in the right ICA are consistent with a 1-39% stenosis.   Left Carotid: Velocities in the left ICA are consistent with a 40-59% stenosis.   Vertebrals:  Bilateral vertebral arteries demonstrate antegrade flow. Subclavians: Normal flow hemodynamics were seen in bilateral subclavian              arteries. Right: Normal examination. No evidence of arterial occlusive disease. No obvious common femoral or popliteal artery aneurysm. Irregular, heterogenous plaque is seen on the common femoral arteries bilaterally.   Left: Normal examination. No evidence of arterial occlusive disease. No obvious common femoral or popliteal artery aneurysm. Irregular, heterogenous plaque is seen on the common femoral arteries bilaterally.        Assessment & Recommendations:   1. Preoperative cardiovascular examination Patient does have risk factors for CAD and although asymptomatic, in view of vascular procedure would recommend further evaluation with Lexiscan nuclear stress testing. He has not been able to strenuously exercise due to recent surgery and also has bilateral knee osteoarthritis, would recommend lexiscan nuclear stress test instead of GXT. Do not feel that he needs echocardiogram as there is no clinical evidence of heart failure.  Unless stress testing is abnormal, we will see him back on a when necessary basis.  No changes were made in medications today.  2. Abdominal aortic aneurysm (AAA) without rupture (HCC) Approximatley 5.0cm. Scheduled for repair with Dr. Trula Slade on 02/14.   3. Essential hypertension Managed by PCP, well-controlled with Lisinopril recommended continuing the same.  4. Mixed hyperlipidemia Recent lipid panel unavailable, being managed by his PCP and he states is well controlled.  5. Carotid stenosis, asymptomatic,  bilateral Has minimal stenosis on the right and mild stenosis on the left that is asymptomatic.  Continue with aspirin and statin therapy.  He will continue to follow with vascular regarding this.   Thank you for referring this  patient. Please do not hesitate to contact me for any questions.    *I have discussed this case with Dr. Virgina Jock and he personally examined the patient and participated in formulating the plan.*   Jeri Lager, [facility], [place] Office: [phone] Fax: [phone]

## 2018-07-11 ENCOUNTER — Other Ambulatory Visit (HOSPITAL_COMMUNITY): Payer: Medicare Other

## 2018-07-11 ENCOUNTER — Ambulatory Visit: Payer: Medicare Other

## 2018-07-11 DIAGNOSIS — Z01810 Encounter for preprocedural cardiovascular examination: Secondary | ICD-10-CM | POA: Diagnosis not present

## 2018-07-11 NOTE — Pre-Procedure Instructions (Addendum)
Ovie Cornelio  07/11/2018      [id number] - [place], [place] [address] [place] [address] Phone: [phone] Fax: [phone]    Your procedure is scheduled on Friday, February 14th.  Report to [facility] at 5:30 A.M.  Call this number if you have problems the morning of surgery:  [phone]   Remember:  Do not eat or drink after midnight.    Take these medicines the morning of surgery with A SIP OF WATER  acetaminophen (TYLENOL)-if needed for pain Aspirin budesonide (ENTOCORT EC)  Eye drops  As of today, STOP taking any Aleve, Naproxen, Ibuprofen, Motrin, Advil, Goody's, BC's, all herbal medications, fish oil, and all vitamins.     Do not wear jewelry.  Do not wear lotions, powders, or colognes, or deodorant.  Men may shave face and neck.  Do not bring valuables to the hospital.  [facility] is not responsible for any belongings or valuables.  Contacts, dentures or bridgework may not be worn into surgery.  Leave your suitcase in the car.  After surgery it may be brought to your room.  For patients admitted to the hospital, discharge time will be determined by your treatment team.  Patients discharged the day of surgery will not be allowed to drive home.   Special instructions:   - Preparing For Surgery  Before surgery, you can play an important role. Because skin is not sterile, your skin needs to be as free of germs as possible. You can reduce the number of germs on your skin by washing with CHG (chlorahexidine gluconate) Soap before surgery.  CHG is an antiseptic cleaner which kills germs and bonds with the skin to continue killing germs even after washing.    Oral Hygiene is also important to reduce your risk of infection.  Remember - BRUSH YOUR TEETH THE MORNING OF SURGERY WITH YOUR REGULAR TOOTHPASTE  Please do not use if you have an allergy to CHG or  antibacterial soaps. If your skin becomes reddened/irritated stop using the CHG.  Do not shave (including legs and underarms) for at least 48 hours prior to first CHG shower. It is OK to shave your face.  Please follow these instructions carefully.   1. Shower the NIGHT BEFORE SURGERY and the MORNING OF SURGERY with CHG.   2. If you chose to wash your hair, wash your hair first as usual with your normal shampoo.  3. After you shampoo, rinse your hair and body thoroughly to remove the shampoo.  4. Use CHG as you would any other liquid soap. You can apply CHG directly to the skin and wash gently with a scrungie or a clean washcloth.   5. Apply the CHG Soap to your body ONLY FROM THE NECK DOWN.  Do not use on open wounds or open sores. Avoid contact with your eyes, ears, mouth and genitals (private parts). Wash Face and genitals (private parts)  with your normal soap.  6. Wash thoroughly, paying special attention to the area where your surgery will be performed.  7. Thoroughly rinse your body with warm water from the neck down.  8. DO NOT shower/wash with your normal soap after using and rinsing off the CHG Soap.  9. Pat yourself dry with a CLEAN TOWEL.  10. Wear CLEAN PAJAMAS to bed the night before surgery, wear comfortable clothes the morning of surgery  11. Place CLEAN  SHEETS on your bed the night of your first shower and DO NOT SLEEP WITH PETS.    Day of Surgery:  Do not apply any deodorants/lotions.  Please wear clean clothes to the hospital/surgery center.   Remember to brush your teeth WITH YOUR REGULAR TOOTHPASTE.   Please read over the following fact sheets that you were given.

## 2018-07-12 ENCOUNTER — Encounter: Payer: Self-pay | Admitting: Cardiology

## 2018-07-12 ENCOUNTER — Encounter (HOSPITAL_COMMUNITY): Payer: Self-pay

## 2018-07-12 ENCOUNTER — Encounter (HOSPITAL_COMMUNITY)
Admission: RE | Admit: 2018-07-12 | Discharge: 2018-07-12 | Disposition: A | Discharge status: Home / Self Care | Payer: Medicare Other | Source: Ambulatory Visit | Attending: Surgery | Admitting: Surgery

## 2018-07-12 ENCOUNTER — Other Ambulatory Visit: Payer: Self-pay

## 2018-07-12 DIAGNOSIS — Z01812 Encounter for preprocedural laboratory examination: Secondary | ICD-10-CM | POA: Insufficient documentation

## 2018-07-12 LAB — CBC
HCT: 36.7 % — ABNORMAL LOW (ref 39.0–52.0)
Hemoglobin: 12.5 g/dL — ABNORMAL LOW (ref 13.0–17.0)
MCH: 35.4 pg — ABNORMAL HIGH (ref 26.0–34.0)
MCHC: 34.1 g/dL (ref 30.0–36.0)
MCV: 104 fL — ABNORMAL HIGH (ref 80.0–100.0)
NRBC: 0.3 % — AB (ref 0.0–0.2)
Platelets: 200 10*3/uL (ref 150–400)
RBC: 3.53 MIL/uL — ABNORMAL LOW (ref 4.22–5.81)
RDW: 13.2 % (ref 11.5–15.5)
WBC: 6.1 10*3/uL (ref 4.0–10.5)

## 2018-07-12 LAB — TYPE AND SCREEN
ABO/RH(D): O POS
Antibody Screen: NEGATIVE

## 2018-07-12 LAB — COMPREHENSIVE METABOLIC PANEL
ALT: 31 U/L (ref 0–44)
AST: 26 U/L (ref 15–41)
Albumin: 4.1 g/dL (ref 3.5–5.0)
Alkaline Phosphatase: 77 U/L (ref 38–126)
Anion gap: 14 (ref 5–15)
BILIRUBIN TOTAL: 0.6 mg/dL (ref 0.3–1.2)
BUN: 17 mg/dL (ref 8–23)
CO2: 22 mmol/L (ref 22–32)
Calcium: 9.6 mg/dL (ref 8.9–10.3)
Chloride: 101 mmol/L (ref 98–111)
Creatinine, Ser: 0.82 mg/dL (ref 0.61–1.24)
GFR calc Af Amer: >60 mL/min (ref >60)
GFR calc non Af Amer: >60 mL/min (ref >60)
GLUCOSE: 134 mg/dL — AB (ref 70–99)
Potassium: 3.9 mmol/L (ref 3.5–5.1)
Sodium: 137 mmol/L (ref 135–145)
TOTAL PROTEIN: 6.5 g/dL (ref 6.5–8.1)

## 2018-07-12 LAB — SURGICAL PCR SCREEN
MRSA, PCR: NEGATIVE
Staphylococcus aureus: NEGATIVE

## 2018-07-12 LAB — URINALYSIS, ROUTINE W REFLEX MICROSCOPIC
Bilirubin Urine: NEGATIVE
Glucose, UA: NEGATIVE mg/dL
HGB URINE DIPSTICK: NEGATIVE
Ketones, ur: NEGATIVE mg/dL
LEUKOCYTES UA: NEGATIVE
Nitrite: NEGATIVE
Protein, ur: NEGATIVE mg/dL
Specific Gravity, Urine: 1.023 (ref 1.005–1.030)
pH: 5 (ref 5.0–8.0)

## 2018-07-12 LAB — BLOOD GAS, ARTERIAL
ACID-BASE EXCESS: 1.7 mmol/L (ref 0.0–2.0)
Bicarbonate: 24.8 mmol/L (ref 20.0–28.0)
Drawn by: 449841
FIO2: 21
O2 SAT: 90.5 %
Patient temperature: 98.6
pCO2 arterial: 32.1 mmHg (ref 32.0–48.0)
pH, Arterial: 7.499 — ABNORMAL HIGH (ref 7.350–7.450)
pO2, Arterial: 55.9 mmHg — ABNORMAL LOW (ref 83.0–108.0)

## 2018-07-12 LAB — ABO/RH: ABO/RH(D): O POS

## 2018-07-12 LAB — APTT: APTT: 28 s (ref 24–36)

## 2018-07-12 LAB — PROTIME-INR
INR: 1
PROTHROMBIN TIME: 13.1 s (ref 11.4–15.2)

## 2018-07-12 NOTE — Progress Notes (Signed)
PCP - Dr. Reita Cliche Cardiologist - Saw Dr. Einar Gip for a nuclear ST 07/11/18  Chest x-ray - N/A EKG - 07/06/2018 Stress Test - 07/11/2018 ECHO -denies  Cardiac Cath -denies   Sleep Study - denies  Blood Thinner Instructions:N/A Aspirin Instructions:Continue ASA  Anesthesia review: Yes, pending results of ST.   Patient denies shortness of breath, fever, cough and chest pain at PAT appointment   Patient verbalized understanding of instructions that were given to them at the PAT appointment. Patient was also instructed that they will need to review over the PAT instructions again at home before surgery.

## 2018-07-13 NOTE — Progress Notes (Signed)
Anesthesia Chart Review:  Case:  [id number] Date/Time:  07/15/18 0715   Procedure:  ABDOMINAL AORTIC ENDOVASCULAR STENT GRAFT (N/A )   Anesthesia type:  General   Pre-op diagnosis:  ABDOMINAL AORTIC ANEURYSM   Location:  [facility] OR ROOM 16 / [facility]   Surgeon:  Serafina Mitchell, MD      DISCUSSION: Patient is a 73 year old male scheduled for the above procedure.   History includes former smoker (quit '00), AAA, fatty liver, prediabetes, HTN, HLD, lymphocytic colitis (s/p proximal small bowel resection/appendectomy 01/27/18 ), prostate cancer 2019 (s/p hormone blockade combined with external beam radiation therapy). He had mild bilateral carotid artery disease on 06/2017 carotid Duplex.  He was sent to [facility] for a preoperative cardiology evaluation and was cleared for surgery following a recent nonischemic stress test.   Of note, neurosurgery was consulted after his 01/27/18 surgery at [facility] due to neck pain and stiffness. Xray showed mild spondylolisthesis at C5-6 delt due to underlying spondylosis. Neurosurgery was consulted and recommended a muscle relaxant and PT.    Patient with recent non-ischemic stress test and tolerated small bowel resection less than six months ago. If no acute changes then I would anticipate that he can proceed as planned.   VS: BP 115/61   Pulse 73   Temp 36.7 C   Resp 18   Ht 5\' 9"  (1.753 m)   Wt 82.4 kg   SpO2 98%   BMI 26.82 kg/m    PROVIDERS: Reita Cliche, MD is PCP Jackquline Berlin, MD is cardiologist. He was seen by Aneta Mins, NP on 07/06/18 for preoperative evaluation.   Denyce Robert, MD is urologist ([place])   LABS: Preoperative labs and ABG results noted. ABG results routed to Dr. Trula Slade and [facility] RNs Zigmund Daniel and [place]. (No perioperative respiratory issues noted in review of 01/2018 discharge summary following bowel resection.) (all labs ordered are listed, but only abnormal results are displayed)  Labs Reviewed   BLOOD GAS, ARTERIAL - Abnormal; Notable for the following components:      Result Value   pH, Arterial 7.499 (*)    pO2, Arterial 55.9 (*)    All other components within normal limits  CBC - Abnormal; Notable for the following components:   RBC 3.53 (*)    Hemoglobin 12.5 (*)    HCT 36.7 (*)    MCV 104.0 (*)    MCH 35.4 (*)    nRBC 0.3 (*)    All other components within normal limits  COMPREHENSIVE METABOLIC PANEL - Abnormal; Notable for the following components:   Glucose, Bld 134 (*)    All other components within normal limits  URINALYSIS, ROUTINE W REFLEX MICROSCOPIC - Abnormal; Notable for the following components:   APPearance HAZY (*)    All other components within normal limits  SURGICAL PCR SCREEN  APTT  PROTIME-INR  TYPE AND SCREEN  ABO/RH    IMAGES: CTA abd/pelvis 06/02/18: IMPRESSION: - Redemonstration of infrarenal abdominal aortic aneurysm which is essentially unchanged from the most recent CT, measuring approximately 4.9 cm. Aortic Atherosclerosis (ICD10-I70.0). Aortic aneurysm NOS (ICD10-I71.9). - Surgical changes of small bowel resection in the left abdomen, with associated mild edema and nodularity in the mesenteric fat. The previous 2.7 cm heterogeneous mass within the mesentery is significantly smaller. While the prior differential diagnosis provided on CT from 12/08/2017 (malignancy, carcinoid tumor, desmoid tumor, lymphoma) Still applies, the largest component has significantly decreased in size and is in fact nearly resolved,  a benign feature which suggests postsurgical changes such as scarring/sclerosing mesenteritis, venous congestion, inflammation/edema are the more likely etiology. - Circumferential bladder wall thickening suggesting chronic bladder outlet obstruction. - Colonic diverticular disease without evidence of acute diverticulitis.  CTA chest 11/29/17: FINDINGS - Cardiovascular: Atherosclerotic calcifications of the ascending aorta  and aortic arch. Maximal diameter of the ascending aorta is 3.7 cm. There is no evidence of dissection. There is no obvious intramural hematoma. The great vessels are patent. Mild 3 vessel coronary artery calcifications. - Mediastinum/Nodes: No abnormal mediastinal adenopathy or pericardial effusion. Thyroid is within normal limits. Esophagus is unremarkable. - Lungs/Pleura: No pneumothorax or pleural effusion. Dependent atelectasis. - Musculoskeletal: There is a mild T9 compression deformity of indeterminate age. Fracture lines are visible but ill-defined and this has a subacute appearance. There is some erosion of the superior endplate and pathologic fracture is not excluded.  XR C-spine AP+LAT 02/01/18 [facility] Everywhere): IMPRESSION: No fracture. Mild spondylolisthesis at C5-6 is felt to be due to underlying spondylosis. There is mild cervical levoscoliosis. There is multilevel arthropathy. There is calcification in each carotid artery.  EKG: 07/06/18: NSR, borderline criteria for LVH.   CV: Nuclear stress test 07/11/18: Lexiscan Myoview nuclear stress test 07/11/2018: 1. Lexiscan stress test was performed. Exercise capacity was not assessed. Stress symptoms included  dizziness and abdominal pain. Resting blood pressure was 124/80 mmHg and peak effect blood pressure was 98/56 mmHg. The resting and stress electrocardiogram demonstrated normal sinus rhythm, normal resting conduction, PAC and normal rest repolarization.  Stress EKG is non diagnostic for ischemia as it is a pharmacologic stress.  2. The overall quality of the study is good. There is no evidence of abnormal lung activity. Stress and rest SPECT images demonstrate homogeneous tracer distribution throughout the myocardium. Gated SPECT imaging reveals normal myocardial thickening and wall motion. The left ventricular ejection fraction was normal (75%).   3. Low risk study.   Carotid U/S 06/06/18: Summary: - Right Carotid:  Velocities in the right ICA are consistent with a 1-39% stenosis. - Left Carotid: Velocities in the left ICA are consistent with a 40-59% stenosis. - Vertebrals:  Bilateral vertebral arteries demonstrate antegrade flow. - Subclavians: Normal flow hemodynamics were seen in bilateral subclavian arteries.   Past Medical History:  Diagnosis Date  . AAA (abdominal aortic aneurysm) ([place])   . AAA (abdominal aortic aneurysm) ([place])   . BPH with urinary obstruction    Does not need a alpha blocker yet  . Cancer ([place])   . Chronic idiopathic gout involving toe of left foot without tophus    On Allopurinol  . Diverticulitis   . Dysuria   . Fatty liver   . Gouty arthropathy   . Hyperglycemia    Prediabetic range A1C  . Hyperlipidemia    Mixed.  On Statin.  Marland Kitchen Hypertension    Essential  . Impotence of organic origin   . Lymphocytic colitis    Currently off treatment.  Followed by GI.  Marland Kitchen Nocturnal leg cramps   . Nonspecific elevation of levels of transaminase and lactic acid dehydrogenase (LDH)   . Other specified intestinal infections   . Prediabetes   . Primary osteoarthritis of both knees   . Prostate cancer [facility])     Past Surgical History:  Procedure Laterality Date  . APPENDECTOMY    . COLONOSCOPY    . 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 EYE SURGERY    . KNEE SURGERY Bilateral   .  TOOTH EXTRACTION    . VASECTOMY      MEDICATIONS: . acetaminophen (TYLENOL) 650 MG CR tablet  . allopurinol (ZYLOPRIM) 300 MG tablet  . aspirin EC 81 MG tablet  . budesonide (ENTOCORT EC) 3 MG 24 hr capsule  . Carboxymethylcellulose Sodium (LUBRICANT EYE DROPS OP)  . lisinopril (PRINIVIL,ZESTRIL) 20 MG tablet  . simvastatin (ZOCOR) 40 MG tablet  . terazosin (HYTRIN) 1 MG capsule  . vitamin B-12 (CYANOCOBALAMIN) 1000 MCG tablet  . vitamin E 400 UNIT capsule   No current facility-administered medications for this encounter.     Myra Gianotti, PA-C Surgical  Short Stay/Anesthesiology [facility] Phone [phone] [facility] Phone [phone] 07/13/2018 2:15 PM

## 2018-07-13 NOTE — Anesthesia Preprocedure Evaluation (Addendum)
Anesthesia Evaluation  Patient identified by MRN, date of birth, ID band Patient awake    Reviewed: Allergy & Precautions, NPO status , Patient's Chart, lab work & pertinent test results  Airway Mallampati: III  TM Distance: >3 FB Neck ROM: Limited    Dental no notable dental hx.    Pulmonary former smoker,    Pulmonary exam normal breath sounds clear to auscultation       Cardiovascular hypertension, Pt. on medications Normal cardiovascular exam Rhythm:Regular Rate:Normal  ECG: NSR, rate 62  Patwardan, Manish, MD is cardiologist. He was seen by Aneta Mins, NP on 07/06/18 for preoperative evaluation.    Neuro/Psych negative psych ROS   GI/Hepatic negative GI ROS, Neg liver ROS,   Endo/Other  negative endocrine ROS  Renal/GU negative Renal ROS     Musculoskeletal Gout   Abdominal   Peds  Hematology  (+) anemia , HLD   Anesthesia Other Findings ABDOMINAL AORTIC ANEURYSM  Reproductive/Obstetrics                           Anesthesia Physical Anesthesia Plan  ASA: III  Anesthesia Plan: General   Post-op Pain Management:    Induction: Intravenous  PONV Risk Score and Plan: 2 and Ondansetron, Dexamethasone, Midazolam and Treatment may vary due to age or medical condition  Airway Management Planned: Oral ETT  Additional Equipment: Arterial line  Intra-op Plan:   Post-operative Plan: Extubation in OR  Informed Consent: I have reviewed the patients History and Physical, chart, labs and discussed the procedure including the risks, benefits and alternatives for the proposed anesthesia with the patient or authorized representative who has indicated his/her understanding and acceptance.     Dental advisory given  Plan Discussed with: CRNA  Anesthesia Plan Comments: (Reviewed PAT note written 07/13/2018 by Myra Gianotti, PA-C. )       Anesthesia Quick Evaluation

## 2018-07-15 ENCOUNTER — Inpatient Hospital Stay (HOSPITAL_COMMUNITY): Payer: Medicare Other | Admitting: Anesthesiology

## 2018-07-15 ENCOUNTER — Encounter (HOSPITAL_COMMUNITY): Payer: Self-pay | Admitting: *Deleted

## 2018-07-15 ENCOUNTER — Other Ambulatory Visit: Payer: Self-pay

## 2018-07-15 ENCOUNTER — Inpatient Hospital Stay (HOSPITAL_COMMUNITY): Payer: Medicare Other

## 2018-07-15 ENCOUNTER — Inpatient Hospital Stay (HOSPITAL_COMMUNITY): Payer: Medicare Other | Admitting: Vascular Surgery

## 2018-07-15 ENCOUNTER — Inpatient Hospital Stay (HOSPITAL_COMMUNITY)
Admission: RE | Admit: 2018-07-15 | Discharge: 2018-07-16 | DRG: 269 | Disposition: A | Discharge status: Home / Self Care | Payer: Medicare Other | Attending: Surgery | Admitting: Surgery

## 2018-07-15 ENCOUNTER — Encounter (HOSPITAL_COMMUNITY)
Admission: RE | Disposition: A | Discharge status: Home / Self Care | Payer: Self-pay | Source: Home / Self Care | Attending: Surgery

## 2018-07-15 DIAGNOSIS — Z923 Personal history of irradiation: Secondary | ICD-10-CM

## 2018-07-15 DIAGNOSIS — I10 Essential (primary) hypertension: Secondary | ICD-10-CM | POA: Diagnosis present

## 2018-07-15 DIAGNOSIS — E7800 Pure hypercholesterolemia, unspecified: Secondary | ICD-10-CM | POA: Diagnosis present

## 2018-07-15 DIAGNOSIS — K760 Fatty (change of) liver, not elsewhere classified: Secondary | ICD-10-CM | POA: Diagnosis present

## 2018-07-15 DIAGNOSIS — Z8546 Personal history of malignant neoplasm of prostate: Secondary | ICD-10-CM

## 2018-07-15 DIAGNOSIS — Z87891 Personal history of nicotine dependence: Secondary | ICD-10-CM | POA: Diagnosis not present

## 2018-07-15 DIAGNOSIS — Z95828 Presence of other vascular implants and grafts: Secondary | ICD-10-CM

## 2018-07-15 DIAGNOSIS — Z791 Long term (current) use of non-steroidal anti-inflammatories (NSAID): Secondary | ICD-10-CM | POA: Diagnosis not present

## 2018-07-15 DIAGNOSIS — Z79899 Other long term (current) drug therapy: Secondary | ICD-10-CM

## 2018-07-15 DIAGNOSIS — R7303 Prediabetes: Secondary | ICD-10-CM | POA: Diagnosis present

## 2018-07-15 DIAGNOSIS — I714 Abdominal aortic aneurysm, without rupture, unspecified: Secondary | ICD-10-CM

## 2018-07-15 DIAGNOSIS — Z8679 Personal history of other diseases of the circulatory system: Secondary | ICD-10-CM

## 2018-07-15 DIAGNOSIS — E782 Mixed hyperlipidemia: Secondary | ICD-10-CM | POA: Diagnosis present

## 2018-07-15 HISTORY — PX: ABDOMINAL AORTIC ENDOVASCULAR STENT GRAFT: SHX5707

## 2018-07-15 LAB — CBC
HCT: 31.7 % — ABNORMAL LOW (ref 39.0–52.0)
HEMOGLOBIN: 10.5 g/dL — AB (ref 13.0–17.0)
MCH: 34.3 pg — ABNORMAL HIGH (ref 26.0–34.0)
MCHC: 33.1 g/dL (ref 30.0–36.0)
MCV: 103.6 fL — ABNORMAL HIGH (ref 80.0–100.0)
Platelets: 158 10*3/uL (ref 150–400)
RBC: 3.06 MIL/uL — AB (ref 4.22–5.81)
RDW: 13 % (ref 11.5–15.5)
WBC: 4.5 10*3/uL (ref 4.0–10.5)
nRBC: 0 % (ref 0.0–0.2)

## 2018-07-15 LAB — BASIC METABOLIC PANEL
Anion gap: 10 (ref 5–15)
BUN: 16 mg/dL (ref 8–23)
CO2: 23 mmol/L (ref 22–32)
Calcium: 9.2 mg/dL (ref 8.9–10.3)
Chloride: 106 mmol/L (ref 98–111)
Creatinine, Ser: 0.87 mg/dL (ref 0.61–1.24)
GFR calc non Af Amer: >60 mL/min (ref >60)
Glucose, Bld: 111 mg/dL — ABNORMAL HIGH (ref 70–99)
Potassium: 4 mmol/L (ref 3.5–5.1)
Sodium: 139 mmol/L (ref 135–145)

## 2018-07-15 LAB — POCT ACTIVATED CLOTTING TIME
Activated Clotting Time: 180 seconds
Activated Clotting Time: 197 seconds
Activated Clotting Time: 202 seconds

## 2018-07-15 LAB — POCT I-STAT 7, (LYTES, BLD GAS, ICA,H+H)
Bicarbonate: 24.7 mmol/L (ref 20.0–28.0)
Calcium, Ion: 1.27 mmol/L (ref 1.15–1.40)
HCT: 30 % — ABNORMAL LOW (ref 39.0–52.0)
Hemoglobin: 10.2 g/dL — ABNORMAL LOW (ref 13.0–17.0)
O2 Saturation: 99 %
PCO2 ART: 38.5 mmHg (ref 32.0–48.0)
Patient temperature: 35.4
Potassium: 4.1 mmol/L (ref 3.5–5.1)
Sodium: 137 mmol/L (ref 135–145)
TCO2: 26 mmol/L (ref 22–32)
pH, Arterial: 7.409 (ref 7.350–7.450)
pO2, Arterial: 153 mmHg — ABNORMAL HIGH (ref 83.0–108.0)

## 2018-07-15 LAB — APTT: APTT: 32 s (ref 24–36)

## 2018-07-15 LAB — MAGNESIUM: MAGNESIUM: 1.7 mg/dL (ref 1.7–2.4)

## 2018-07-15 LAB — PROTIME-INR
INR: 1.03
Prothrombin Time: 13.5 seconds (ref 11.4–15.2)

## 2018-07-15 SURGERY — INSERTION, ENDOVASCULAR STENT GRAFT, AORTA, ABDOMINAL
Anesthesia: General

## 2018-07-15 MED ORDER — PANTOPRAZOLE SODIUM 40 MG PO TBEC
40.0000 mg | DELAYED_RELEASE_TABLET | Freq: Every day | ORAL | Status: DC
  Administered 2018-07-15 – 2018-07-16 (×2): 40 mg via ORAL
  Filled 2018-07-15 (×2): qty 1

## 2018-07-15 MED ORDER — IODIXANOL 320 MG/ML IV SOLN
INTRAVENOUS | Status: DC | PRN
  Administered 2018-07-15: 150 mL via INTRAVENOUS

## 2018-07-15 MED ORDER — VITAMIN B-12 1000 MCG PO TABS
1000.0000 ug | ORAL_TABLET | Freq: Every day | ORAL | Status: DC
  Administered 2018-07-16: 1000 ug via ORAL
  Filled 2018-07-15: qty 1

## 2018-07-15 MED ORDER — ACETAMINOPHEN 500 MG PO TABS
1000.0000 mg | ORAL_TABLET | Freq: Once | ORAL | Status: AC
  Administered 2018-07-15: 1000 mg via ORAL
  Filled 2018-07-15: qty 2

## 2018-07-15 MED ORDER — GUAIFENESIN-DM 100-10 MG/5ML PO SYRP
15.0000 mL | ORAL_SOLUTION | ORAL | Status: DC | PRN
  Administered 2018-07-15 (×2): 15 mL via ORAL
  Filled 2018-07-15 (×2): qty 15

## 2018-07-15 MED ORDER — ASPIRIN EC 81 MG PO TBEC
81.0000 mg | DELAYED_RELEASE_TABLET | Freq: Every day | ORAL | Status: DC
  Administered 2018-07-16: 81 mg via ORAL
  Filled 2018-07-15: qty 1

## 2018-07-15 MED ORDER — MIDAZOLAM HCL 2 MG/2ML IJ SOLN
INTRAMUSCULAR | Status: AC
  Filled 2018-07-15: qty 2

## 2018-07-15 MED ORDER — SODIUM CHLORIDE 0.9 % IV SOLN
INTRAVENOUS | Status: DC | PRN
  Administered 2018-07-15: 40 ug/min via INTRAVENOUS

## 2018-07-15 MED ORDER — MIDAZOLAM HCL 5 MG/5ML IJ SOLN
INTRAMUSCULAR | Status: DC | PRN
  Administered 2018-07-15: 1 mg via INTRAVENOUS

## 2018-07-15 MED ORDER — HEPARIN SODIUM (PORCINE) 5000 UNIT/ML IJ SOLN: 5000.0000 [IU] | Freq: Three times a day (TID) | INTRAMUSCULAR | Status: DC

## 2018-07-15 MED ORDER — METOPROLOL TARTRATE 5 MG/5ML IV SOLN: 2.0000 mg | INTRAVENOUS | Status: DC | PRN

## 2018-07-15 MED ORDER — ALUM & MAG HYDROXIDE-SIMETH 200-200-20 MG/5ML PO SUSP: 15.0000 mL | ORAL | Status: DC | PRN

## 2018-07-15 MED ORDER — PROTAMINE SULFATE 10 MG/ML IV SOLN
INTRAVENOUS | Status: DC | PRN
  Administered 2018-07-15: 20 mg via INTRAVENOUS
  Administered 2018-07-15: 30 mg via INTRAVENOUS

## 2018-07-15 MED ORDER — ACETAMINOPHEN 325 MG RE SUPP: 325.0000 mg | RECTAL | Status: DC | PRN

## 2018-07-15 MED ORDER — SODIUM CHLORIDE 0.9 % IV SOLN
INTRAVENOUS | Status: DC | PRN
  Administered 2018-07-15: 08:00:00

## 2018-07-15 MED ORDER — OXYCODONE-ACETAMINOPHEN 5-325 MG PO TABS
1.0000 | ORAL_TABLET | ORAL | Status: DC | PRN
  Administered 2018-07-15: 1 via ORAL
  Administered 2018-07-16: 2 via ORAL
  Filled 2018-07-15: qty 2
  Filled 2018-07-15: qty 1

## 2018-07-15 MED ORDER — FENTANYL CITRATE (PF) 100 MCG/2ML IJ SOLN
INTRAMUSCULAR | Status: DC | PRN
  Administered 2018-07-15: 50 ug via INTRAVENOUS
  Administered 2018-07-15: 100 ug via INTRAVENOUS

## 2018-07-15 MED ORDER — BUDESONIDE 3 MG PO CPEP
3.0000 mg | ORAL_CAPSULE | Freq: Every day | ORAL | Status: DC
  Administered 2018-07-16: 3 mg via ORAL
  Filled 2018-07-15: qty 1

## 2018-07-15 MED ORDER — PHENOL 1.4 % MT LIQD
1.0000 | OROMUCOSAL | Status: DC | PRN
  Administered 2018-07-15: 1 via OROMUCOSAL
  Filled 2018-07-15: qty 177

## 2018-07-15 MED ORDER — ACETAMINOPHEN 325 MG PO TABS: 325.0000 mg | ORAL_TABLET | ORAL | Status: DC | PRN

## 2018-07-15 MED ORDER — ROCURONIUM BROMIDE 100 MG/10ML IV SOLN
INTRAVENOUS | Status: DC | PRN
  Administered 2018-07-15: 70 mg via INTRAVENOUS
  Administered 2018-07-15: 30 mg via INTRAVENOUS

## 2018-07-15 MED ORDER — POLYETHYLENE GLYCOL 3350 17 G PO PACK
17.0000 g | PACK | Freq: Every day | ORAL | Status: DC | PRN
  Administered 2018-07-16: 17 g via ORAL
  Filled 2018-07-15: qty 1

## 2018-07-15 MED ORDER — FENTANYL CITRATE (PF) 100 MCG/2ML IJ SOLN: 25.0000 ug | INTRAMUSCULAR | Status: DC | PRN

## 2018-07-15 MED ORDER — TERAZOSIN HCL 1 MG PO CAPS
1.0000 mg | ORAL_CAPSULE | Freq: Every day | ORAL | Status: DC
  Administered 2018-07-15: 1 mg via ORAL
  Filled 2018-07-15: qty 1

## 2018-07-15 MED ORDER — CHLORHEXIDINE GLUCONATE CLOTH 2 % EX PADS: 6.0000 | MEDICATED_PAD | Freq: Once | CUTANEOUS | Status: DC

## 2018-07-15 MED ORDER — PROPOFOL 10 MG/ML IV BOLUS
INTRAVENOUS | Status: AC
  Filled 2018-07-15: qty 20

## 2018-07-15 MED ORDER — LIDOCAINE HCL (CARDIAC) PF 100 MG/5ML IV SOSY
PREFILLED_SYRINGE | INTRAVENOUS | Status: DC | PRN
  Administered 2018-07-15: 60 mg via INTRAVENOUS

## 2018-07-15 MED ORDER — 0.9 % SODIUM CHLORIDE (POUR BTL) OPTIME
TOPICAL | Status: DC | PRN
  Administered 2018-07-15: 1000 mL

## 2018-07-15 MED ORDER — ESMOLOL HCL 100 MG/10ML IV SOLN
INTRAVENOUS | Status: DC | PRN
  Administered 2018-07-15: 30 mg via INTRAVENOUS

## 2018-07-15 MED ORDER — BISACODYL 10 MG RE SUPP: 10.0000 mg | Freq: Every day | RECTAL | Status: DC | PRN

## 2018-07-15 MED ORDER — SODIUM CHLORIDE 0.9 % IV SOLN
INTRAVENOUS | Status: DC
  Administered 2018-07-16: 06:00:00 via INTRAVENOUS

## 2018-07-15 MED ORDER — CEFAZOLIN SODIUM-DEXTROSE 2-4 GM/100ML-% IV SOLN
2.0000 g | INTRAVENOUS | Status: AC
  Administered 2018-07-15: 2 g via INTRAVENOUS
  Filled 2018-07-15: qty 100

## 2018-07-15 MED ORDER — SODIUM CHLORIDE 0.9 % IV SOLN: 500.0000 mL | Freq: Once | INTRAVENOUS | Status: DC | PRN

## 2018-07-15 MED ORDER — PHENYLEPHRINE HCL 10 MG/ML IJ SOLN
INTRAMUSCULAR | Status: DC | PRN
  Administered 2018-07-15 (×2): 80 ug via INTRAVENOUS

## 2018-07-15 MED ORDER — CEFAZOLIN SODIUM-DEXTROSE 2-4 GM/100ML-% IV SOLN
2.0000 g | Freq: Three times a day (TID) | INTRAVENOUS | Status: AC
  Administered 2018-07-15 (×2): 2 g via INTRAVENOUS
  Filled 2018-07-15 (×2): qty 100

## 2018-07-15 MED ORDER — POTASSIUM CHLORIDE CRYS ER 20 MEQ PO TBCR: 20.0000 meq | EXTENDED_RELEASE_TABLET | Freq: Every day | ORAL | Status: DC | PRN

## 2018-07-15 MED ORDER — DOCUSATE SODIUM 100 MG PO CAPS
100.0000 mg | ORAL_CAPSULE | Freq: Every day | ORAL | Status: DC
  Administered 2018-07-16: 100 mg via ORAL
  Filled 2018-07-15: qty 1

## 2018-07-15 MED ORDER — ONDANSETRON HCL 4 MG/2ML IJ SOLN: 4.0000 mg | Freq: Four times a day (QID) | INTRAMUSCULAR | Status: DC | PRN

## 2018-07-15 MED ORDER — SUGAMMADEX SODIUM 200 MG/2ML IV SOLN
INTRAVENOUS | Status: DC | PRN
  Administered 2018-07-15: 400 mg via INTRAVENOUS

## 2018-07-15 MED ORDER — MAGNESIUM SULFATE 2 GM/50ML IV SOLN: 2.0000 g | Freq: Every day | INTRAVENOUS | Status: DC | PRN

## 2018-07-15 MED ORDER — LACTATED RINGERS IV SOLN
INTRAVENOUS | Status: DC | PRN
  Administered 2018-07-15: 08:00:00 via INTRAVENOUS

## 2018-07-15 MED ORDER — MORPHINE SULFATE (PF) 2 MG/ML IV SOLN: 2.0000 mg | INTRAVENOUS | Status: DC | PRN

## 2018-07-15 MED ORDER — ONDANSETRON HCL 4 MG/2ML IJ SOLN
INTRAMUSCULAR | Status: DC | PRN
  Administered 2018-07-15: 4 mg via INTRAVENOUS

## 2018-07-15 MED ORDER — FENTANYL CITRATE (PF) 250 MCG/5ML IJ SOLN
INTRAMUSCULAR | Status: AC
  Filled 2018-07-15: qty 5

## 2018-07-15 MED ORDER — HEPARIN SODIUM (PORCINE) 1000 UNIT/ML IJ SOLN
INTRAMUSCULAR | Status: DC | PRN
  Administered 2018-07-15: 3000 [IU] via INTRAVENOUS
  Administered 2018-07-15: 8000 [IU] via INTRAVENOUS

## 2018-07-15 MED ORDER — LABETALOL HCL 5 MG/ML IV SOLN: 10.0000 mg | INTRAVENOUS | Status: DC | PRN

## 2018-07-15 MED ORDER — PROPOFOL 10 MG/ML IV BOLUS
INTRAVENOUS | Status: DC | PRN
  Administered 2018-07-15: 200 mg via INTRAVENOUS

## 2018-07-15 MED ORDER — SODIUM CHLORIDE 0.9 % IV SOLN: INTRAVENOUS | Status: DC

## 2018-07-15 MED ORDER — HYDRALAZINE HCL 20 MG/ML IJ SOLN: 5.0000 mg | INTRAMUSCULAR | Status: DC | PRN

## 2018-07-15 MED ORDER — LACTATED RINGERS IV SOLN
INTRAVENOUS | Status: DC | PRN
  Administered 2018-07-15: 07:00:00 via INTRAVENOUS

## 2018-07-15 MED ORDER — EPHEDRINE SULFATE 50 MG/ML IJ SOLN
INTRAMUSCULAR | Status: DC | PRN
  Administered 2018-07-15 (×3): 10 mg via INTRAVENOUS

## 2018-07-15 MED ORDER — SIMVASTATIN 20 MG PO TABS
40.0000 mg | ORAL_TABLET | Freq: Every evening | ORAL | Status: DC
  Administered 2018-07-15: 40 mg via ORAL
  Filled 2018-07-15: qty 2

## 2018-07-15 MED ORDER — ONDANSETRON HCL 4 MG/2ML IJ SOLN: 4.0000 mg | Freq: Once | INTRAMUSCULAR | Status: DC | PRN

## 2018-07-15 MED ORDER — SODIUM CHLORIDE 0.9 % IV SOLN
INTRAVENOUS | Status: AC
  Filled 2018-07-15: qty 1.2

## 2018-07-15 SURGICAL SUPPLY — 62 items
BLADE CLIPPER SURG (BLADE) ×3 IMPLANT
CANISTER SUCT 3000ML PPV (MISCELLANEOUS) ×3 IMPLANT
CATH BEACON 5.038 65CM KMP-01 (CATHETERS) ×3 IMPLANT
CATH OMNI FLUSH .035X70CM (CATHETERS) ×3 IMPLANT
CATH SOFTOUCH MOTARJEME 5F (CATHETERS) ×3 IMPLANT
COVER PROBE W GEL 5X96 (DRAPES) ×3 IMPLANT
COVER WAND RF STERILE (DRAPES) ×3 IMPLANT
DERMABOND ADVANCED (GAUZE/BANDAGES/DRESSINGS) ×4
DERMABOND ADVANCED .7 DNX12 (GAUZE/BANDAGES/DRESSINGS) ×2 IMPLANT
DEVICE CLOSURE PERCLS PRGLD 6F (VASCULAR PRODUCTS) ×4 IMPLANT
DEVICE TORQUE H2O (MISCELLANEOUS) ×3 IMPLANT
DRAPE ZERO GRAVITY STERILE (DRAPES) ×3 IMPLANT
DRSG TEGADERM 2-3/8X2-3/4 SM (GAUZE/BANDAGES/DRESSINGS) ×6 IMPLANT
DRYSEAL FLEXSHEATH 12FR 33CM (SHEATH) ×2
DRYSEAL FLEXSHEATH 16FR 33CM (SHEATH) ×2
ELECT CAUTERY BLADE 6.4 (BLADE) ×3 IMPLANT
ELECT REM PT RETURN 9FT ADLT (ELECTROSURGICAL) ×6
ELECTRODE REM PT RTRN 9FT ADLT (ELECTROSURGICAL) ×2 IMPLANT
EXCLUDER TNK LEG 23MX12X18 (Endovascular Graft) ×1 IMPLANT
EXCLUDER TRUNK LEG 23MX12X18 (Endovascular Graft) ×3 IMPLANT
GAUZE SPONGE 2X2 8PLY STRL LF (GAUZE/BANDAGES/DRESSINGS) ×2 IMPLANT
GLOVE BIO SURGEON STRL SZ 6.5 (GLOVE) ×2 IMPLANT
GLOVE BIO SURGEONS STRL SZ 6.5 (GLOVE) ×1
GLOVE BIOGEL PI IND STRL 7.5 (GLOVE) ×1 IMPLANT
GLOVE BIOGEL PI INDICATOR 7.5 (GLOVE) ×2
GLOVE SURG SS PI 7.5 STRL IVOR (GLOVE) ×3 IMPLANT
GOWN STRL REUS W/ TWL LRG LVL3 (GOWN DISPOSABLE) ×2 IMPLANT
GOWN STRL REUS W/ TWL XL LVL3 (GOWN DISPOSABLE) ×2 IMPLANT
GOWN STRL REUS W/TWL LRG LVL3 (GOWN DISPOSABLE) ×4
GOWN STRL REUS W/TWL XL LVL3 (GOWN DISPOSABLE) ×4
GRAFT BALLN CATH 65CM (STENTS) ×1 IMPLANT
GUIDEWIRE ANGLED .035X150CM (WIRE) ×3 IMPLANT
KIT BASIN OR (CUSTOM PROCEDURE TRAY) ×3 IMPLANT
KIT TURNOVER KIT B (KITS) ×3 IMPLANT
LEG CONTRALATERAL 16X14.5X14 (Vascular Products) ×3 IMPLANT
NEEDLE PERC 18GX7CM (NEEDLE) IMPLANT
NS IRRIG 1000ML POUR BTL (IV SOLUTION) ×3 IMPLANT
PACK ENDOVASCULAR (PACKS) ×3 IMPLANT
PAD ARMBOARD 7.5X6 YLW CONV (MISCELLANEOUS) ×6 IMPLANT
PENCIL BUTTON HOLSTER BLD 10FT (ELECTRODE) ×3 IMPLANT
PERCLOSE PROGLIDE 6F (VASCULAR PRODUCTS) ×12
SET MICROPUNCTURE 5F STIFF (MISCELLANEOUS) ×3 IMPLANT
SHEATH BRITE TIP 8FR 23CM (SHEATH) ×3 IMPLANT
SHEATH DRYSEAL FLEX 12FR 33CM (SHEATH) ×1 IMPLANT
SHEATH DRYSEAL FLEX 16FR 33CM (SHEATH) ×1 IMPLANT
SHEATH PINNACLE 8F 10CM (SHEATH) ×3 IMPLANT
SHIELD RADPAD SCOOP 12X17 (MISCELLANEOUS) ×12 IMPLANT
SPONGE GAUZE 2X2 STER 10/PKG (GAUZE/BANDAGES/DRESSINGS) ×4
STENT GRAFT BALLN CATH 65CM (STENTS) ×2
STOPCOCK MORSE 400PSI 3WAY (MISCELLANEOUS) ×3 IMPLANT
SUT PROLENE 5 0 C 1 24 (SUTURE) IMPLANT
SUT VIC AB 2-0 CT1 27 (SUTURE)
SUT VIC AB 2-0 CT1 TAPERPNT 27 (SUTURE) IMPLANT
SUT VIC AB 3-0 SH 27 (SUTURE)
SUT VIC AB 3-0 SH 27X BRD (SUTURE) IMPLANT
SUT VICRYL 4-0 PS2 18IN ABS (SUTURE) IMPLANT
SYR 30ML LL (SYRINGE) ×3 IMPLANT
TOWEL GREEN STERILE (TOWEL DISPOSABLE) ×3 IMPLANT
TRAY FOLEY MTR SLVR 16FR STAT (SET/KITS/TRAYS/PACK) ×3 IMPLANT
TUBING HIGH PRESSURE 120CM (CONNECTOR) ×3 IMPLANT
WIRE AMPLATZ SS-J .035X180CM (WIRE) ×6 IMPLANT
WIRE BENTSON .035X145CM (WIRE) ×6 IMPLANT

## 2018-07-15 NOTE — Discharge Instructions (Signed)
  [facility]   Discharge Instructions  Endovascular Aortic Aneurysm Repair  Please refer to the following instructions for your post-procedure care. Your surgeon or Physician Assistant will discuss any changes with you.  Activity  You are encouraged to walk as much as you can. You can slowly return to normal activities but must avoid strenuous activity and heavy lifting until your doctor tells you it's OK. Avoid activities such as vacuuming or swinging a gold club. It is normal to feel tired for several weeks after your surgery. Do not drive until your doctor gives the OK and you are no longer taking prescription pain medications. It is also normal to have difficulty with sleep habits, eating, and bowel movements after surgery. These will go away with time.  Bathing/Showering  Shower daily after you go home.  Do not soak in a bathtub, hot tub, or swim until the incision heals completely.  If you have incisions in your groin, wash the groin wounds with soap and water daily and pat dry. (No tub bath-only shower)  Then put a dry gauze or washcloth there to keep this area dry to help prevent wound infection daily and as needed.  Do not use Vaseline or neosporin on your incisions.  Only use soap and water on your incisions and then protect and keep dry.  Incision Care  Shower every day. Clean your incision with mild soap and water. Pat the area dry with a clean towel. You do not need a bandage unless otherwise instructed. Do not apply any ointments or creams to your incision. If you clothing is irritating, you may cover your incision with a dry gauze pad.  Diet  Resume your normal diet. There are no special food restrictions following this procedure. A low fat/low cholesterol diet is recommended for all patients with vascular disease. In order to heal from your surgery, it is CRITICAL to get adequate nutrition. Your body requires vitamins, minerals, and protein.  Vegetables are the best source of vitamins and minerals. Vegetables also provide the perfect balance of protein. Processed food has little nutritional value, so try to avoid this.  Medications  Resume taking all of your medications unless your doctor or nurse practitioner tells you not to. If your incision is causing pain, you may take over-the-counter pain relievers such as acetaminophen (Tylenol). If you were prescribed a stronger pain medication, please be aware these medications can cause nausea and constipation. Prevent nausea by taking the medication with a snack or meal. Avoid constipation by drinking plenty of fluids and eating foods with a high amount of fiber, such as fruits, vegetables, and grains.  Do not take Tylenol if you are taking prescription pain medications.   Follow up  Our office will schedule a follow-up appointment with a CT scan 3-4 weeks after your surgery.  Please call us immediately for any of the following conditions  Severe or worsening pain in your legs or feet or in your abdomen back or chest. Increased pain, redness, drainage (pus) from your incision site. Increased abdominal pain, bloating, nausea, vomiting or persistent diarrhea. Fever of 101 degrees or higher. Swelling in your leg (s),  Reduce your risk of vascular disease  Stop smoking. If you would like help call [facility] at 1-800-QUIT-NOW (1-800-784-8669) or [facility] at 336-586-4000. Manage your cholesterol Maintain a desired weight Control your diabetes Keep your blood pressure down  If you have questions, please call the office at [phone].  

## 2018-07-15 NOTE — Addendum Note (Signed)
Addendum  created 07/15/18 1626 by Murvin Natal, MD   Clinical Note Signed

## 2018-07-15 NOTE — Transfer of Care (Signed)
Immediate Anesthesia Transfer of Care Note  Patient: Trevor Mason  Procedure(s) Performed: ABDOMINAL AORTIC ENDOVASCULAR STENT GRAFT (N/A )  Patient Location: PACU  Anesthesia Type:General  Level of Consciousness: awake, alert  and oriented  Airway & Oxygen Therapy: Patient Spontanous Breathing and Patient connected to nasal cannula oxygen  Post-op Assessment: Report given to RN, Post -op Vital signs reviewed and stable and Patient moving all extremities  Post vital signs: Reviewed and stable  Last Vitals:  Vitals Value Taken Time  BP 134/85 07/15/2018  9:45 AM  Temp    Pulse 67 07/15/2018  9:47 AM  Resp 19 07/15/2018  9:47 AM  SpO2 99 % 07/15/2018  9:47 AM  Vitals shown include unvalidated device data.  Last Pain:  Vitals:   07/15/18 0600  TempSrc:   PainSc: 0-No pain      Patients Stated Pain Goal: 6 (31/49/70 2637)  Complications: No apparent anesthesia complications

## 2018-07-15 NOTE — Anesthesia Postprocedure Evaluation (Signed)
Anesthesia Post Note  Patient: Trevor Mason  Procedure(s) Performed: ABDOMINAL AORTIC ENDOVASCULAR STENT GRAFT (N/A )     Anesthesia Post Evaluation  Last Vitals:  Vitals:   07/15/18 1127 07/15/18 1400  BP: (!) 154/70   Pulse: (!) 59 70  Resp: 18 19  Temp: (!) 36.3 C   SpO2: 100% 97%    Last Pain:  Vitals:   07/15/18 1127  TempSrc: Oral  PainSc:                  Karyl Kinnier [place]

## 2018-07-15 NOTE — Anesthesia Postprocedure Evaluation (Signed)
Anesthesia Post Note  Patient: Trevor Mason  Procedure(s) Performed: ABDOMINAL AORTIC ENDOVASCULAR STENT GRAFT (N/A )     Anesthesia Post Evaluation  Last Vitals:  Vitals:   07/15/18 1127 07/15/18 1400  BP: (!) 154/70   Pulse: (!) 59 70  Resp: 18 19  Temp: (!) 36.3 C   SpO2: 100% 97%    Last Pain:  Vitals:   07/15/18 1127  TempSrc: Oral  PainSc:                  Trevor Mason

## 2018-07-15 NOTE — Discharge Summary (Signed)
EVAR Discharge Summary   Trevor Mason [birth year] 73 y.o. male  MRN: [id number]  Admission Date: 07/15/2018  Discharge Date: 07/16/2018  Physician: Serafina Mitchell, MD  Admission Diagnosis: ABDOMINAL AORTIC ANEURYSM   HPI:   This is a 73 y.o. male who returns today for further evaluation of his abdominal aortic aneurysm. This was first discovered on MRI when looking for etiology of back pain many years ago. It has been followed in [facility]. He has not had any vascular interventions in the past.  The patient takes a statin for hypercholesterolemia. He is on ACE inhibitor for hypertension. He is a former smoker. He does suffer from colitis and osteoarthritis of his knee.  He is recently completed radiation therapy for prostate cancer and has also undergone a partial bowel resection for benign etiology  Hospital Course:  The patient was admitted to the hospital and taken to the operating room on 07/15/2018 and underwent: EVAR    Findings: Complete exclusion  The pt tolerated the procedure well and was transported to the PACU in good condition.   By POD 1, he was doing well with palpable pedal pulses.  Bilateral groins soft without hematoma.    The remainder of the hospital course consisted of increasing mobilization and increasing intake of solids without difficulty.  CBC    Component Value Date/Time   WBC 8.2 07/16/2018 0545   RBC 2.99 (L) 07/16/2018 0545   HGB 10.8 (L) 07/16/2018 0545   HCT 30.7 (L) 07/16/2018 0545   PLT 154 07/16/2018 0545   MCV 102.7 (H) 07/16/2018 0545   MCH 36.1 (H) 07/16/2018 0545   MCHC 35.2 07/16/2018 0545   RDW 12.8 07/16/2018 0545    BMET    Component Value Date/Time   NA 134 (L) 07/16/2018 0545   K 3.7 07/16/2018 0545   CL 104 07/16/2018 0545   CO2 23 07/16/2018 0545   GLUCOSE 112 (H) 07/16/2018 0545   BUN 11 07/16/2018 0545   CREATININE 0.81 07/16/2018 0545   CALCIUM 8.7 (L) 07/16/2018 0545   GFRNONAA >60 07/16/2018  0545   GFRAA >60 07/16/2018 0545       Discharge Instructions    Discharge patient   Complete by:  As directed    Discharge home after he has walked in the halls, voided more and Dr. Oneida Alar has seen pt.  Thanks!   Discharge disposition:  01-Home or Self Care   Discharge patient date:  07/16/2018      Discharge Diagnosis:  ABDOMINAL AORTIC ANEURYSM  Secondary Diagnosis: Patient Active Problem List   Diagnosis Date Noted  . Preoperative cardiovascular examination 07/06/2018  . Carotid stenosis, asymptomatic, bilateral 07/06/2018  . Impingement syndrome of left shoulder 07/02/2018  . Neck pain on right side 02/01/2018  . Primary malignant neoplasm of prostate with high risk of recurrence due to Gleason score of 8 to 10 and PSA greater than 20 ([place]) 11/19/2017  . Elevated PSA 10/29/2017  . Lymphocytic colitis 08/24/2017  . Effusion of both knee joints 03/07/2017  . Hyperplastic colon polyp 08/11/2016  . AAA (abdominal aortic aneurysm) ([place]) 08/05/2015  . Benign prostatic hyperplasia with urinary obstruction 08/05/2015  . Chronic idiopathic gout involving toe of left foot without tophus 08/05/2015  . Essential hypertension 08/05/2015  . Fatty liver 08/05/2015  . Hyperglycemia 08/05/2015  . Mixed hyperlipidemia 08/05/2015  . Right knee pain 08/28/2014   Past Medical History:  Diagnosis Date  . AAA (abdominal aortic aneurysm) ([place])   . AAA (abdominal  aortic aneurysm) ([place])   . BPH with urinary obstruction    Does not need a alpha blocker yet  . Cancer ([place])   . Chronic idiopathic gout involving toe of left foot without tophus    On Allopurinol  . Diverticulitis   . Dysuria   . Fatty liver   . Gouty arthropathy   . Hyperglycemia    Prediabetic range A1C  . Hyperlipidemia    Mixed.  On Statin.  Marland Kitchen Hypertension    Essential  . Impotence of organic origin   . Lymphocytic colitis    Currently off treatment.  Followed by GI.  Marland Kitchen Nocturnal leg cramps   . Nonspecific  elevation of levels of transaminase and lactic acid dehydrogenase (LDH)   . Other specified intestinal infections   . Prediabetes   . Primary osteoarthritis of both knees   . Prostate cancer ([place])      Allergies as of 07/16/2018   No Known Allergies     Medication List    TAKE these medications   acetaminophen 650 MG CR tablet Commonly known as:  TYLENOL Take 1,300 mg by mouth every 8 (eight) hours as needed for pain.   allopurinol 300 MG tablet Commonly known as:  ZYLOPRIM Take 300 mg by mouth every evening.   aspirin EC 81 MG tablet Take 81 mg by mouth daily.   budesonide 3 MG 24 hr capsule Commonly known as:  ENTOCORT EC Take 3 mg by mouth daily.   lisinopril 20 MG tablet Commonly known as:  PRINIVIL,ZESTRIL Take 20 mg by mouth daily.   LUBRICANT EYE DROPS OP Place 1 drop into both eyes daily.   oxyCODONE 5 MG immediate release tablet Commonly known as:  ROXICODONE Take 1 tablet (5 mg total) by mouth every 6 (six) hours as needed.   simvastatin 40 MG tablet Commonly known as:  ZOCOR Take 40 mg by mouth every evening.   terazosin 1 MG capsule Commonly known as:  HYTRIN Take 1 mg by mouth at bedtime.   vitamin B-12 1000 MCG tablet Commonly known as:  CYANOCOBALAMIN Take 1,000 mcg by mouth daily.   vitamin E 400 UNIT capsule Take 800 Units by mouth every evening.       Discharge Instructions:  [facility]  Discharge Instructions Endovascular Aortic Aneurysm Repair  Please refer to the following instructions for your post-procedure care. Your surgeon or Physician Assistant will discuss any changes with you.  Activity  You are encouraged to walk as much as you can. You can slowly return to normal activities but must avoid strenuous activity and heavy lifting until your doctor tells you it's OK. Avoid activities such as vacuuming or swinging a gold club. It is normal to feel tired for several weeks after your surgery. Do  not drive until your doctor gives the OK and you are no longer taking prescription pain medications. It is also normal to have difficulty with sleep habits, eating, and bowel movements after surgery. These will go away with time.  Bathing/Showering  You may shower after you go home. If you have an incision, do not soak in a bathtub, hot tub, or swim until the incision heals completely.  Incision Care  Shower every day. Clean your incision with mild soap and water. Pat the area dry with a clean towel. You do not need a bandage unless otherwise instructed. Do not apply any ointments or creams to your incision. If you clothing is irritating, you may  cover your incision with a dry gauze pad.  Diet  Resume your normal diet. There are no special food restrictions following this procedure. A low fat/low cholesterol diet is recommended for all patients with vascular disease. In order to heal from your surgery, it is CRITICAL to get adequate nutrition. Your body requires vitamins, minerals, and protein. Vegetables are the best source of vitamins and minerals. Vegetables also provide the perfect balance of protein. Processed food has little nutritional value, so try to avoid this.  Medications  Resume taking all of your medications unless your doctor or Physician Assistnat tells you not to. If your incision is causing pain, you may take over-the-counter pain relievers such as acetaminophen (Tylenol). If you were prescribed a stronger pain medication, please be aware these medications can cause nausea and constipation. Prevent nausea by taking the medication with a snack or meal. Avoid constipation by drinking plenty of fluids and eating foods with a high amount of fiber, such as fruits, vegetables, and grains.  Do not take Tylenol if you are taking prescription pain medications.   Follow up  [place] office will schedule a follow-up appointment with a C.T. scan 3-4 weeks after your surgery.  Please call us  immediately for any of the following conditions  . Severe or worsening pain in your legs or feet or in your abdomen back or chest. . Increased pain, redness, drainage (pus) from your incision sit. . Increased abdominal pain, bloating, nausea, vomiting or persistent diarrhea. . Fever of 101 degrees or higher. . Swelling in your leg (s), .  Reduce your risk of vascular disease  .Stop smoking. If you would like help call [facility] at 1-800-QUIT-NOW 531-665-4890) or [place] at 412-307-7869. .Manage your cholesterol .Maintain a desired weight .Control your diabetes .Keep your blood pressure down  If you have questions, please call the office at [phone].    Prescriptions given: 1.  Roxicodone #8 No Refill   Disposition: home  Patient's condition: is Good  Follow up: 1. Dr. Trula Slade in 4 weeks with CTA protocol   Leontine Locket, PA-C [facility] [phone] 07/16/2018  7:59 AM   - For VQI Registry use - Post-op:  Time to Extubation: [x]  In OR, [ ]  < 12 hrs, [ ]  12-24 hrs, [ ]  >=24 hrs Vasopressors Req. Post-op: No MI: No., [ ]  Troponin only, [ ]  EKG or Clinical New Arrhythmia: No CHF: No ICU Stay: 1 day in stepdown Transfusion: No     If yes, n/a units given  Complications: Resp failure: No., [ ]  Pneumonia, [ ]  Ventilator Chg in renal function: No., [ ]  Inc. Cr > 0.5, [ ]  Temp. Dialysis,  [ ]  Permanent dialysis Leg ischemia: No., no Surgery needed, [ ]  Yes, Surgery needed,  [ ]  Amputation Bowel ischemia: No., [ ]  Medical Rx, [ ]  Surgical Rx Wound complication: No., [ ]  Superficial separation/infection, [ ]  Return to OR Return to OR: No  Return to OR for bleeding: No Stroke: No., [ ]  Minor, [ ]  Major  Discharge medications: Statin use:  Yes  ASA use:  Yes  Plavix use:  No  Beta blocker use:  No  ARB use:  No ACEI use:  Yes CCB use:  No

## 2018-07-15 NOTE — Op Note (Addendum)
Patient name: Trevor Mason MRN: [id number] DOB: 10-21-1945 Sex: male  07/15/2018 Pre-operative Diagnosis: AAA Post-operative diagnosis:  Same Surgeon:  Annamarie Major Assistants:  Leontine Locket Procedure:   #1: Endovascular repair of infrarenal abdominal aortic aneurysm   #2: Ultrasound-guided bilateral common femoral artery access   #3: Abdominal aortogram   #4: Catheter in aorta x2 Anesthesia: General Blood Loss: Minimal Specimens: None  Findings: Complete exclusion  Devices used: Main body was primary left Gore 23 x 12 x 18 contralateral right was a Gore 14 x 14  Indications: The patient has had progressive enlargement of his infrarenal abdominal aortic aneurysm.  He now comes in today for repair.  Procedure:  The patient was identified in the holding area and taken to [place] [facility]  The patient was then placed supine on the table. general anesthesia was administered.  The patient was prepped and draped in the usual sterile fashion.  A time out was called and antibiotics were administered.  Ultrasound was used to evaluate bilateral common femoral arteries which were widely patent with posterior plaque.  A #11 blade was used to make a skin nick bilaterally.  Under ultrasound guidance, bilateral common femoral arteries were cannulated with a micropuncture needle.  An 018 wire was advanced without resistance and the micropuncture sheath was placed.  The 018 wire was removed and a Bentson wire was inserted.  The subcutaneous tract was dilated with an 8 [place] dilator.  Pro-glide devices were deployed at the 11:00 and 1 o'clock position for pre-closure.  Over Amplatz superstiff wires, a 74 French sheath was advanced at the left side and a 12 French sheath was advanced up the right.  The patient was fully heparinized.  The main body device was prepared on the back table.  This was a Gore 23 x 12 x 18 device.  It was advanced up the left side and positioned at the level of L1.  A second access  on the 12 French sheath on the right side was obtained and through this a Omni Flush catheter was placed to the level of L1.  An abdominal aortogram was performed locating the renal arteries.  Main body device was deployed down the contralateral gate.  The limbs did not cross.  I then cannulated the contralateral gate with a reverse curve catheter.  A Omni Flush catheter was able be freely rotated within the main body of the device confirming successful cannulation.  Next, the image detector was rotated to a left anterior oblique position and a retrograde injection was performed through the sheath in the right groin locating the right hypogastric artery.  A Gore 14 x 14 contralateral limb was selected and inserted through the sheath and positioned at the level of the right hypogastric artery.  The device was then deployed.  The image detector was then rotated to a left anterior oblique position and a retrograde injection from the sheath in the left groin was performed locating the left hypogastric artery.  The remaining portion of the ipsilateral limb was then deployed landing at the level of the left hypogastric artery.  Next, a MOB balloon was used to mold the proximal and distal attachment sites as well as device overlap.  A completion arteriogram was then performed which showed successful exclusion of the aneurysm with continued patency of bilateral renal arteries as well as bilateral hypogastric and external iliac arteries.  Amplatz superstiff wires were exchanged for Bentson wires.  The sheaths were then removed and the  pro-glide devices were secured closing the arteriotomy site.  The patient's heparin was reversed with protamine.  Manual pressure was held on the groins for 5 minutes and the incisions were closed with 4-0 Vicryl followed by Dermabond.  Patient had brisk Doppler signals in both feet bilaterally.  He was successfully extubated and taken recovery in stable condition.  There were no immediate  complications.   Disposition: To PACU stable.   Theotis Burrow, M.D. [facility] [place] Office: [phone] Pager:  [phone]

## 2018-07-15 NOTE — Anesthesia Procedure Notes (Signed)
Arterial Line Insertion Start/End2/14/2020 7:05 AM, 07/15/2018 7:20 AM Performed by: Klever Twyford T, Immunologist, CRNA  Patient location: Pre-op. Preanesthetic checklist: patient identified, IV checked, risks and benefits discussed, surgical consent, monitors and equipment checked, pre-op evaluation and timeout performed Lidocaine 1% used for infiltration Left, radial was placed Catheter size: 20 G Hand hygiene performed  and maximum sterile barriers used   Attempts: 1 Procedure performed without using ultrasound guided technique. Following insertion, dressing applied and Biopatch. Post procedure assessment: normal  Patient tolerated the procedure well with no immediate complications.

## 2018-07-15 NOTE — Interval H&P Note (Signed)
History and Physical Interval Note:  07/15/2018 7:23 AM  Trevor Mason  has presented today for surgery, with the diagnosis of ABDOMINAL AORTIC ANEURYSM  The various methods of treatment have been discussed with the patient and family. After consideration of risks, benefits and other options for treatment, the patient has consented to  Procedure(s): ABDOMINAL AORTIC ENDOVASCULAR STENT GRAFT (N/A) as a surgical intervention .  The patient's history has been reviewed, patient examined, no change in status, stable for surgery.  I have reviewed the patient's chart and labs.  Questions were answered to the patient's satisfaction.     Annamarie Major

## 2018-07-15 NOTE — Anesthesia Procedure Notes (Signed)
Procedure Name: Intubation Date/Time: 07/15/2018 7:49 AM Performed by: Elyssa Pendelton T, CRNA Pre-anesthesia Checklist: Patient identified, Emergency Drugs available, Suction available and Patient being monitored Patient Re-evaluated:Patient Re-evaluated prior to induction Oxygen Delivery Method: Circle system utilized Preoxygenation: Pre-oxygenation with 100% oxygen Induction Type: IV induction Ventilation: Mask ventilation without difficulty Laryngoscope Size: Glidescope and 4 Tube type: Oral Tube size: 7.5 mm Number of attempts: 2 Airway Equipment and Method: Patient positioned with wedge pillow,  Stylet and Video-laryngoscopy Placement Confirmation: ETT inserted through vocal cords under direct vision,  positive ETCO2 and breath sounds checked- equal and bilateral Secured at: 22 cm Tube secured with: Tape Dental Injury: Teeth and Oropharynx as per pre-operative assessment  Difficulty Due To: Difficulty was anticipated, Difficult Airway- due to anterior larynx and Difficult Airway- due to limited oral opening Future Recommendations: Recommend- induction with short-acting agent, and alternative techniques readily available

## 2018-07-16 LAB — BASIC METABOLIC PANEL
Anion gap: 7 (ref 5–15)
BUN: 11 mg/dL (ref 8–23)
CO2: 23 mmol/L (ref 22–32)
Calcium: 8.7 mg/dL — ABNORMAL LOW (ref 8.9–10.3)
Chloride: 104 mmol/L (ref 98–111)
Creatinine, Ser: 0.81 mg/dL (ref 0.61–1.24)
GFR calc Af Amer: >60 mL/min (ref >60)
GFR calc non Af Amer: >60 mL/min (ref >60)
Glucose, Bld: 112 mg/dL — ABNORMAL HIGH (ref 70–99)
Potassium: 3.7 mmol/L (ref 3.5–5.1)
Sodium: 134 mmol/L — ABNORMAL LOW (ref 135–145)

## 2018-07-16 LAB — CBC
HCT: 30.7 % — ABNORMAL LOW (ref 39.0–52.0)
HEMOGLOBIN: 10.8 g/dL — AB (ref 13.0–17.0)
MCH: 36.1 pg — ABNORMAL HIGH (ref 26.0–34.0)
MCHC: 35.2 g/dL (ref 30.0–36.0)
MCV: 102.7 fL — ABNORMAL HIGH (ref 80.0–100.0)
Platelets: 154 10*3/uL (ref 150–400)
RBC: 2.99 MIL/uL — ABNORMAL LOW (ref 4.22–5.81)
RDW: 12.8 % (ref 11.5–15.5)
WBC: 8.2 10*3/uL (ref 4.0–10.5)
nRBC: 0 % (ref 0.0–0.2)

## 2018-07-16 MED ORDER — OXYCODONE HCL 5 MG PO TABS: 5.0000 mg | ORAL_TABLET | Freq: Four times a day (QID) | ORAL | 0 refills | Status: DC | PRN

## 2018-07-16 NOTE — Progress Notes (Addendum)
  Progress Note    07/16/2018 7:46 AM 1 Day Post-Op  Subjective:  Has some soreness   Afebrile HR 60's 767'M-094'B systolic 09% RA   Vitals:   07/15/18 2345 07/16/18 0600  BP: 124/63 137/62  Pulse: 63 65  Resp: (!) 23 19  Temp: (!) 97.5 F (36.4 C) (!) 97.5 F (36.4 C)  SpO2: 96% 96%    Physical Exam: Cardiac:  regular Lungs:  Non labored Incisions:  Bilateral groins soft without hematoma Extremities:  Bilateral palpable DP pulses Abdomen:  Soft, NT  CBC    Component Value Date/Time   WBC 8.2 07/16/2018 0545   RBC 2.99 (L) 07/16/2018 0545   HGB 10.8 (L) 07/16/2018 0545   HCT 30.7 (L) 07/16/2018 0545   PLT 154 07/16/2018 0545   MCV 102.7 (H) 07/16/2018 0545   MCH 36.1 (H) 07/16/2018 0545   MCHC 35.2 07/16/2018 0545   RDW 12.8 07/16/2018 0545    BMET    Component Value Date/Time   NA 134 (L) 07/16/2018 0545   K 3.7 07/16/2018 0545   CL 104 07/16/2018 0545   CO2 23 07/16/2018 0545   GLUCOSE 112 (H) 07/16/2018 0545   BUN 11 07/16/2018 0545   CREATININE 0.81 07/16/2018 0545   CALCIUM 8.7 (L) 07/16/2018 0545   GFRNONAA >60 07/16/2018 0545   GFRAA >60 07/16/2018 0545    INR    Component Value Date/Time   INR 1.03 07/15/2018 0943     Intake/Output Summary (Last 24 hours) at 07/16/2018 0746 Last data filed at 07/16/2018 0530 Gross per 24 hour  Intake 2514 ml  Output 4475 ml  Net -1961 ml     Assessment:  73 y.o. male is s/p:  EVAR  1 Day Post-Op  Plan: -pt doing well this morning with palpable pedal pulses -his foley was removed this morning and he has voided but a small amount. -needs to ambulate -Discharge later this morning.  He will f/u with Dr. Trula Slade in 4 weeks with CTA protocol.     Leontine Locket, PA-C [facility] [phone] 07/16/2018 7:46 AM   Agree with above 2+ posterior tibial pulse right foot no palpable pedal pulses left foot feet pink and warm bilaterally no hematoma in groins patient ready for  discharge today  Ruta Hinds, MD [facility] [place] Office: [phone] Pager: [phone]

## 2018-07-18 ENCOUNTER — Encounter (HOSPITAL_COMMUNITY): Payer: Self-pay | Admitting: Surgery

## 2018-07-21 ENCOUNTER — Emergency Department (HOSPITAL_COMMUNITY)
Admission: EM | Admit: 2018-07-21 | Discharge: 2018-07-21 | Disposition: A | Discharge status: Home / Self Care | Payer: Medicare Other | Attending: Emergency Medicine | Admitting: Emergency Medicine

## 2018-07-21 ENCOUNTER — Encounter (HOSPITAL_COMMUNITY): Payer: Self-pay | Admitting: Emergency Medicine

## 2018-07-21 ENCOUNTER — Emergency Department (HOSPITAL_COMMUNITY): Payer: Medicare Other

## 2018-07-21 DIAGNOSIS — M545 Low back pain, unspecified: Secondary | ICD-10-CM

## 2018-07-21 DIAGNOSIS — Z9889 Other specified postprocedural states: Secondary | ICD-10-CM

## 2018-07-21 DIAGNOSIS — Z8679 Personal history of other diseases of the circulatory system: Secondary | ICD-10-CM

## 2018-07-21 DIAGNOSIS — R109 Unspecified abdominal pain: Secondary | ICD-10-CM | POA: Insufficient documentation

## 2018-07-21 LAB — COMPREHENSIVE METABOLIC PANEL
ALBUMIN: 3.6 g/dL (ref 3.5–5.0)
ALT: 69 U/L — ABNORMAL HIGH (ref 0–44)
AST: 43 U/L — ABNORMAL HIGH (ref 15–41)
Alkaline Phosphatase: 97 U/L (ref 38–126)
Anion gap: 12 (ref 5–15)
BUN: 22 mg/dL (ref 8–23)
CHLORIDE: 97 mmol/L — AB (ref 98–111)
CO2: 22 mmol/L (ref 22–32)
Calcium: 9.4 mg/dL (ref 8.9–10.3)
Creatinine, Ser: 0.98 mg/dL (ref 0.61–1.24)
GFR calc Af Amer: >60 mL/min (ref >60)
GFR calc non Af Amer: >60 mL/min (ref >60)
Glucose, Bld: 133 mg/dL — ABNORMAL HIGH (ref 70–99)
Potassium: 3.7 mmol/L (ref 3.5–5.1)
Sodium: 131 mmol/L — ABNORMAL LOW (ref 135–145)
Total Bilirubin: 0.7 mg/dL (ref 0.3–1.2)
Total Protein: 7.1 g/dL (ref 6.5–8.1)

## 2018-07-21 LAB — URINALYSIS, ROUTINE W REFLEX MICROSCOPIC
Bilirubin Urine: NEGATIVE
Glucose, UA: NEGATIVE mg/dL
Hgb urine dipstick: NEGATIVE
Ketones, ur: NEGATIVE mg/dL
Leukocytes,Ua: NEGATIVE
Nitrite: NEGATIVE
PH: 7 (ref 5.0–8.0)
Protein, ur: NEGATIVE mg/dL
Specific Gravity, Urine: 1.033 — ABNORMAL HIGH (ref 1.005–1.030)

## 2018-07-21 LAB — CBC
HEMATOCRIT: 33.7 % — AB (ref 39.0–52.0)
Hemoglobin: 11.3 g/dL — ABNORMAL LOW (ref 13.0–17.0)
MCH: 34.5 pg — ABNORMAL HIGH (ref 26.0–34.0)
MCHC: 33.5 g/dL (ref 30.0–36.0)
MCV: 102.7 fL — AB (ref 80.0–100.0)
Platelets: 238 10*3/uL (ref 150–400)
RBC: 3.28 MIL/uL — ABNORMAL LOW (ref 4.22–5.81)
RDW: 12.5 % (ref 11.5–15.5)
WBC: 9.1 10*3/uL (ref 4.0–10.5)
nRBC: 0 % (ref 0.0–0.2)

## 2018-07-21 LAB — LIPASE, BLOOD: Lipase: 26 U/L (ref 11–51)

## 2018-07-21 MED ORDER — IOPAMIDOL (ISOVUE-300) INJECTION 61%
100.0000 mL | Freq: Once | INTRAVENOUS | Status: AC | PRN
  Administered 2018-07-21: 100 mL via INTRAVENOUS

## 2018-07-21 MED ORDER — SODIUM CHLORIDE 0.9% FLUSH: 3.0000 mL | Freq: Once | INTRAVENOUS | Status: DC

## 2018-07-21 MED ORDER — OXYCODONE-ACETAMINOPHEN 5-325 MG PO TABS: 1.0000 | ORAL_TABLET | ORAL | 0 refills | Status: DC | PRN

## 2018-07-21 MED ORDER — MORPHINE SULFATE (PF) 4 MG/ML IV SOLN
4.0000 mg | Freq: Once | INTRAVENOUS | Status: AC
  Administered 2018-07-21: 4 mg via INTRAVENOUS
  Filled 2018-07-21: qty 1

## 2018-07-21 MED ORDER — KETOROLAC TROMETHAMINE 30 MG/ML IJ SOLN: 30.0000 mg | Freq: Once | INTRAMUSCULAR | Status: DC

## 2018-07-21 NOTE — ED Provider Notes (Signed)
[facility] EMERGENCY DEPARTMENT Provider Note   CSN: [id number] Arrival date & time: 07/21/18  7622    History   Chief Complaint Chief Complaint  Patient presents with  . Abdominal Pain  . Back Pain    HPI Trevor Mason is a 73 y.o. male.     Patient is a 73 year old male with history of abdominal aortic aneurysm, status post endovascular repair by Dr. Trula Slade 6 days ago.  Since discharge, he has been experiencing pain in his low back and abdomen that has worsened.  He denies any fevers or chills.  He denies any bloody stool or vomit.  The history is provided by the patient.  Abdominal Pain  Pain location:  Generalized Pain quality: aching   Pain radiates to:  Back Pain severity:  Moderate Onset quality:  Gradual Duration:  5 days Timing:  Constant Progression:  Worsening Chronicity:  New Context comment:  Status post AAA repair Relieved by:  Nothing Worsened by:  Nothing Back Pain  Associated symptoms: abdominal pain     Past Medical History:  Diagnosis Date  . AAA (abdominal aortic aneurysm) ([place])   . AAA (abdominal aortic aneurysm) ([place])   . BPH with urinary obstruction    Does not need a alpha blocker yet  . Cancer ([place])   . Chronic idiopathic gout involving toe of left foot without tophus    On Allopurinol  . Diverticulitis   . Dysuria   . Fatty liver   . Gouty arthropathy   . Hyperglycemia    Prediabetic range A1C  . Hyperlipidemia    Mixed.  On Statin.  Marland Kitchen Hypertension    Essential  . Impotence of organic origin   . Lymphocytic colitis    Currently off treatment.  Followed by GI.  Marland Kitchen Nocturnal leg cramps   . Nonspecific elevation of levels of transaminase and lactic acid dehydrogenase (LDH)   . Other specified intestinal infections   . Prediabetes   . Primary osteoarthritis of both knees   . Prostate cancer [facility])     Patient Active Problem List   Diagnosis Date Noted  . Preoperative cardiovascular examination 07/06/2018   . Carotid stenosis, asymptomatic, bilateral 07/06/2018  . Impingement syndrome of left shoulder 07/02/2018  . Neck pain on right side 02/01/2018  . Primary malignant neoplasm of prostate with high risk of recurrence due to Gleason score of 8 to 10 and PSA greater than 20 ([place]) 11/19/2017  . Elevated PSA 10/29/2017  . Lymphocytic colitis 08/24/2017  . Effusion of both knee joints 03/07/2017  . Hyperplastic colon polyp 08/11/2016  . AAA (abdominal aortic aneurysm) ([place]) 08/05/2015  . Benign prostatic hyperplasia with urinary obstruction 08/05/2015  . Chronic idiopathic gout involving toe of left foot without tophus 08/05/2015  . Essential hypertension 08/05/2015  . Fatty liver 08/05/2015  . Hyperglycemia 08/05/2015  . Mixed hyperlipidemia 08/05/2015  . Right knee pain 08/28/2014    Past Surgical History:  Procedure Laterality Date  . ABDOMINAL AORTIC ENDOVASCULAR STENT GRAFT  07/15/2018  . ABDOMINAL AORTIC ENDOVASCULAR STENT GRAFT N/A 07/15/2018   Procedure: ABDOMINAL AORTIC ENDOVASCULAR STENT GRAFT;  Surgeon: Serafina Mitchell, MD;  Location: [facility];  Service: Vascular;  Laterality: N/A;  . APPENDECTOMY    . COLONOSCOPY    . 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 EYE SURGERY    . KNEE SURGERY Bilateral   . TOOTH EXTRACTION    . VASECTOMY  Home Medications    Prior to Admission medications   Medication Sig Start Date End Date Taking? Authorizing Provider  acetaminophen (TYLENOL) 650 MG CR tablet Take 1,300 mg by mouth every 8 (eight) hours as needed for pain.    [provider]  allopurinol (ZYLOPRIM) 300 MG tablet Take 300 mg by mouth every evening.     [provider]  aspirin EC 81 MG tablet Take 81 mg by mouth daily.    [provider]  budesonide (ENTOCORT EC) 3 MG 24 hr capsule Take 3 mg by mouth daily.     [provider]  Carboxymethylcellulose Sodium (LUBRICANT EYE DROPS OP) Place 1 drop into both  eyes daily.    [provider]  lisinopril (PRINIVIL,ZESTRIL) 20 MG tablet Take 20 mg by mouth daily.    [provider]  oxyCODONE (ROXICODONE) 5 MG immediate release tablet Take 1 tablet (5 mg total) by mouth every 6 (six) hours as needed. 07/16/18   Rhyne, Hulen Shouts, PA-C  simvastatin (ZOCOR) 40 MG tablet Take 40 mg by mouth every evening.     [provider]  terazosin (HYTRIN) 1 MG capsule Take 1 mg by mouth at bedtime.    [provider]  vitamin B-12 (CYANOCOBALAMIN) 1000 MCG tablet Take 1,000 mcg by mouth daily.    [provider]  vitamin E 400 UNIT capsule Take 800 Units by mouth every evening.     [provider]    Family History Family History  Problem Relation Age of Onset  . Hypertension Mother   . Stroke Mother   . Brain cancer Father   . Hypertension Sister   . COPD Brother   . Diabetic kidney disease Sister     Social History Social History   Tobacco Use  . Smoking status: Former Smoker    Packs/day: 2.00    Years: 40.00    Pack years: 80.00    Types: Cigarettes    Last attempt to quit: 08/25/1998    Years since quitting: 19.9  . Smokeless tobacco: Never Used  Substance Use Topics  . Alcohol use: Yes    Comment: couple drinks a week  . Drug use: Never     Allergies   Patient has no known allergies.   Review of Systems Review of Systems  Gastrointestinal: Positive for abdominal pain.  Musculoskeletal: Positive for back pain.  All other systems reviewed and are negative.    Physical Exam Updated Vital Signs BP 106/68 (BP Location: Left Arm)   Pulse 91   Temp 97.8 F (36.6 C) (Oral)   Resp 16   SpO2 100%   Physical Exam Vitals signs and nursing note reviewed.  Constitutional:      General: He is not in acute distress.    Appearance: He is well-developed. He is not diaphoretic.  HENT:     Head: Normocephalic and atraumatic.  Neck:     Musculoskeletal: Normal range of motion and neck  supple.  Cardiovascular:     Rate and Rhythm: Normal rate and regular rhythm.     Heart sounds: No murmur. No friction rub.  Pulmonary:     Effort: Pulmonary effort is normal. No respiratory distress.     Breath sounds: Normal breath sounds. No wheezing or rales.  Abdominal:     General: Bowel sounds are normal. There is no distension.     Palpations: Abdomen is soft.     Tenderness: There is abdominal tenderness in the right  lower quadrant, suprapubic area and left lower quadrant. There is no guarding or rebound.  Musculoskeletal: Normal range of motion.  Skin:    General: Skin is warm and dry.  Neurological:     Mental Status: He is alert and oriented to person, place, and time.     Coordination: Coordination normal.      ED Treatments / Results  Labs (all labs ordered are listed, but only abnormal results are displayed) Labs Reviewed  CBC - Abnormal; Notable for the following components:      Result Value   RBC 3.28 (*)    Hemoglobin 11.3 (*)    HCT 33.7 (*)    MCV 102.7 (*)    MCH 34.5 (*)    All other components within normal limits  LIPASE, BLOOD  COMPREHENSIVE METABOLIC PANEL  URINALYSIS, ROUTINE W REFLEX MICROSCOPIC    EKG None  Radiology No results found.  Procedures Procedures (including critical care time)  Medications Ordered in ED Medications  sodium chloride flush (NS) 0.9 % injection 3 mL (has no administration in time range)  morphine 4 MG/ML injection 4 mg (has no administration in time range)     Initial Impression / Assessment and Plan / ED Course  I have reviewed the triage vital signs and the nursing notes.  Pertinent labs & imaging results that were available during my care of the patient were reviewed by me and considered in my medical decision making (see chart for details).  Patient presents here with complaints of back pain.  He is 6 days status post endovascular aneurysm repair.  CT scan was obtained today which showed  postoperative changes, however no acute abnormality.  These findings were discussed with Dr. Trula Slade who performed the procedure and he does not feel as though any acute intervention is warranted.  Patient will be discharged with additional pain medication and is to follow-up with vascular surgery as scheduled.  Final Clinical Impressions(s) / ED Diagnoses   Final diagnoses:  None    ED Discharge Orders    None       Veryl Speak, MD 07/21/18 1420

## 2018-07-21 NOTE — Discharge Instructions (Addendum)
Percocet as prescribed as needed for pain.  Follow-up with vascular surgery as scheduled, and return to the ER if your symptoms significantly worsen or change.

## 2018-07-21 NOTE — ED Triage Notes (Signed)
Pt with abdominal and back pain since Friday was d/c from hospital on Saturday from aneurysm repair denies issues with site. Denies fevers n/v/d reports small bm yesterday.

## 2018-07-21 NOTE — ED Notes (Signed)
Patient verbalized understanding of d/c. Opportunity for questioning was offered-patient did not have any questions at this time

## 2018-07-27 ENCOUNTER — Telehealth: Payer: Self-pay | Admitting: Surgery

## 2018-07-27 NOTE — Telephone Encounter (Addendum)
Levada Dy is aware and will notify Dr. Len Childs. Thurston Hole., LPN ----- Message from Serafina Mitchell, MD sent at 07/26/2018  9:13 PM EST ----- Regarding: RE: Medication question That would be fine ----- Message ----- From: Kaleen Mask, LPN Sent: 6/60/6301   4:10 PM EST To: Serafina Mitchell, MD Subject: Medication question                            Hey Dr. Trula Slade.  Dr. Len Childs at [facility] would like to know if you think it is ok for him to prescribe a Medrol Dose Pak for this pt given his medical history?  He saw him in the office today and he was having some pain.  I advised them to call the patient's PCP but they insisted on getting your input.  Their number is [phone].  Thank you,  Thurston Hole., LPN

## 2018-08-01 ENCOUNTER — Other Ambulatory Visit: Payer: Self-pay

## 2018-08-01 DIAGNOSIS — I714 Abdominal aortic aneurysm, without rupture, unspecified: Secondary | ICD-10-CM

## 2018-08-01 DIAGNOSIS — I713 Abdominal aortic aneurysm, ruptured, unspecified: Secondary | ICD-10-CM

## 2018-08-02 ENCOUNTER — Telehealth: Payer: Self-pay | Admitting: Surgery

## 2018-08-02 NOTE — Telephone Encounter (Signed)
-----   Message from Pennelope Bracken sent at 07/27/2018  9:27 AM EST -----   ----- Message ----- From: Natividad Brood Sent: 07/15/2018   9:32 AM EST To: [facility] Admin Pool, [facility] Charge Pool  S/p EVAR 07/15/2018.  F/u with Dr. Trula Slade in 4 weeks with CTA protocol.  Thanks

## 2018-08-02 NOTE — Telephone Encounter (Signed)
sch appt lvm mld ltr 08/10/2018 1pm CTA  08/15/2018 315pm f/u MD

## 2018-08-10 ENCOUNTER — Ambulatory Visit
Admission: RE | Admit: 2018-08-10 | Discharge: 2018-08-10 | Disposition: A | Discharge status: Home / Self Care | Payer: Medicare Other | Source: Ambulatory Visit | Attending: Surgery | Admitting: Surgery

## 2018-08-10 DIAGNOSIS — I714 Abdominal aortic aneurysm, without rupture, unspecified: Secondary | ICD-10-CM

## 2018-08-10 DIAGNOSIS — I713 Abdominal aortic aneurysm, ruptured, unspecified: Secondary | ICD-10-CM

## 2018-08-10 MED ORDER — IOPAMIDOL (ISOVUE-370) INJECTION 76%
75.0000 mL | Freq: Once | INTRAVENOUS | Status: AC | PRN
  Administered 2018-08-10: 75 mL via INTRAVENOUS

## 2018-08-15 ENCOUNTER — Ambulatory Visit (INDEPENDENT_AMBULATORY_CARE_PROVIDER_SITE_OTHER): Payer: Self-pay | Admitting: Surgery

## 2018-08-15 ENCOUNTER — Other Ambulatory Visit: Payer: Self-pay

## 2018-08-15 ENCOUNTER — Encounter: Payer: Self-pay | Admitting: Surgery

## 2018-08-15 VITALS — BP 114/63 | HR 82 | Temp 98.0°F | Resp 20 | Ht 69.0 in | Wt 178.0 lb

## 2018-08-15 DIAGNOSIS — I714 Abdominal aortic aneurysm, without rupture, unspecified: Secondary | ICD-10-CM

## 2018-08-15 NOTE — Progress Notes (Signed)
   Patient name: Trevor Mason MRN: [id number] DOB: [birth year] Sex: male  REASON FOR VISIT:     post op  HISTORY OF PRESENT ILLNESS:   Trevor Mason is a 73 y.o. male wjo is s/p EVAR on 07-15-2018 for a 5.0 cm AAA.  He was discharged on POD #1.  He later came to the ED for back pain, which improved with steroids.  CURRENT MEDICATIONS:    Current Outpatient Medications  Medication Sig Dispense Refill  . acetaminophen (TYLENOL) 650 MG CR tablet Take 1,300 mg by mouth every 8 (eight) hours as needed for pain.    Marland Kitchen allopurinol (ZYLOPRIM) 300 MG tablet Take 300 mg by mouth every evening.     Marland Kitchen aspirin EC 81 MG tablet Take 81 mg by mouth daily.    . budesonide (ENTOCORT EC) 3 MG 24 hr capsule Take 3 mg by mouth daily.     . Carboxymethylcellulose Sodium (LUBRICANT EYE DROPS OP) Place 1 drop into both eyes daily.    Marland Kitchen lisinopril (PRINIVIL,ZESTRIL) 20 MG tablet Take 20 mg by mouth daily.    . simvastatin (ZOCOR) 40 MG tablet Take 40 mg by mouth every evening.     . terazosin (HYTRIN) 1 MG capsule Take 1 mg by mouth at bedtime.    . vitamin B-12 (CYANOCOBALAMIN) 1000 MCG tablet Take 1,000 mcg by mouth daily.    . vitamin E 400 UNIT capsule Take 800 Units by mouth every evening.      No current facility-administered medications for this visit.     REVIEW OF SYSTEMS:   [X]  denotes positive finding, [ ]  denotes negative finding Cardiac  Comments:  Chest pain or chest pressure:    Shortness of breath upon exertion:    Short of breath when lying flat:    Irregular heart rhythm:    Constitutional    Fever or chills:      PHYSICAL EXAM:   Vitals:   08/15/18 1507  BP: 114/63  Pulse: 82  Resp: 20  Temp: 98 F (36.7 C)  SpO2: 99%  Weight: 80.7 kg  Height: 5\' 9"  (1.753 m)    GENERAL: The patient is a well-nourished male, in no acute distress. The vital signs are documented above. CARDIOVASCULAR: There is a regular rate and rhythm. PULMONARY:  Non-labored respirations   STUDIES:   VASCULAR  1. Patent juxtarenal bifurcated stent graft with type 2 endoleak, slight increase in native sac diameter to 5.1 cm. 2. 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  NON-VASCULAR  1. No acute findings. 2. Sigmoid diverticulosis.  MEDICAL ISSUES:   S/P EVAR with now a type II leak.  He is moving to [place] and will need to get a CTA in 6 months.  As a back up, I will order a scan and he will cancel if he is in [place], IV, MD, FACS [facility] [phone] Pager [phone]

## 2020-02-19 IMAGING — MR MR PROSTATE WO/W CM
55 series · 55 of 55 positions shown · IV contrast (multihance)
Comparison: Bone scan dated 11/30/2017, CT from 11/29/2017

CLINICAL DATA: Elevated PSA level, including Gleason 5 + 4 = 9
especially in the left gland on recent biopsy.

EXAM:
MR PROSTATE WITHOUT AND WITH CONTRAST
TECHNIQUE: Multiplanar multisequence MRI images were obtained of the pelvis
centered about the prostate. Pre and post contrast images were
obtained.
CONTRAST:  17mL MULTIHANCE GADOBENATE DIMEGLUMINE 529 MG/ML IV SOLN

[Series 3: T1 · axial · 8.0mm · 0.74mm/px · 1 of 23 slices shown (1 of 2)]
[im 1/23]
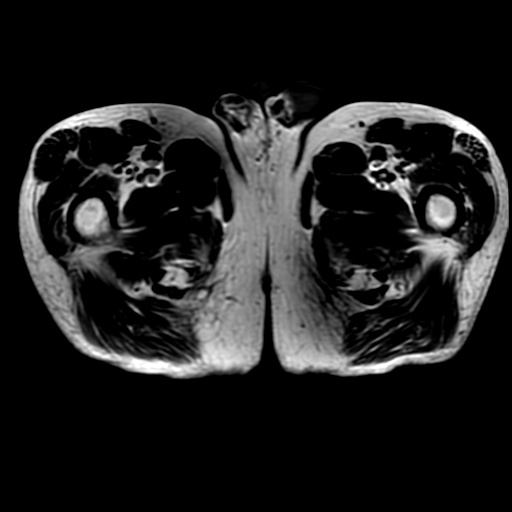

[Series 4: T2 · sagittal · 3.5mm · 0.62mm/px · 1 of 36 slices shown (1 of 3)]
[im 1/36]
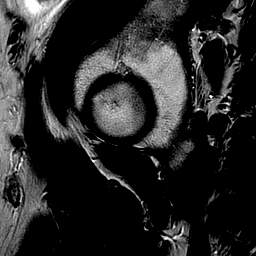

[Series 5: T1 · axial · 3.0mm · 0.31mm/px · 1 of 29 slices shown (2 of 2)]
[im 1/29]
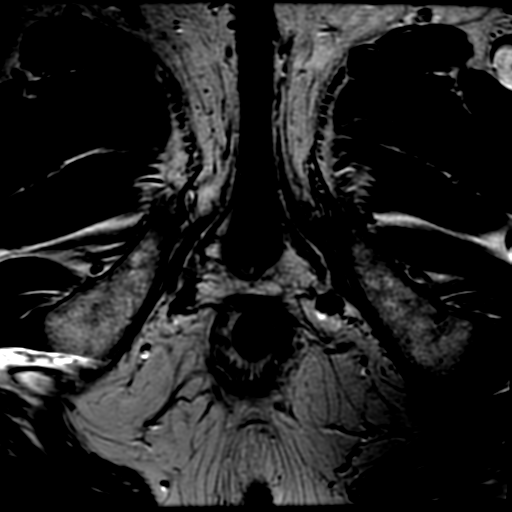

[Series 6: T2 · axial · 3.0mm · 0.62mm/px · 1 of 29 slices shown (2 of 3)]
[im 1/29]
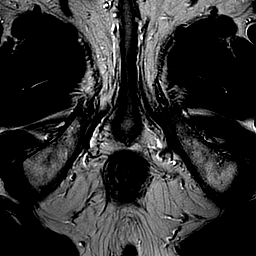

[Series 8: T2 · coronal · 3.0mm · 0.62mm/px · 1 of 27 slices shown (3 of 3)]
[im 1/27]
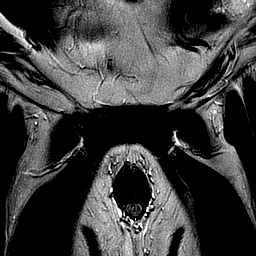

[Series 9: DWI · axial · 3.0mm · 0.70mm/px · 1 of 96 slices shown]
[im 1/96]
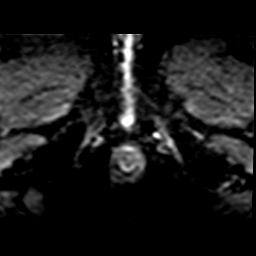

[Series 10: bSSFP fat-sat · axial · 8.0mm · 0.74mm/px · 1 of 23 slices shown]
[im 1/23]
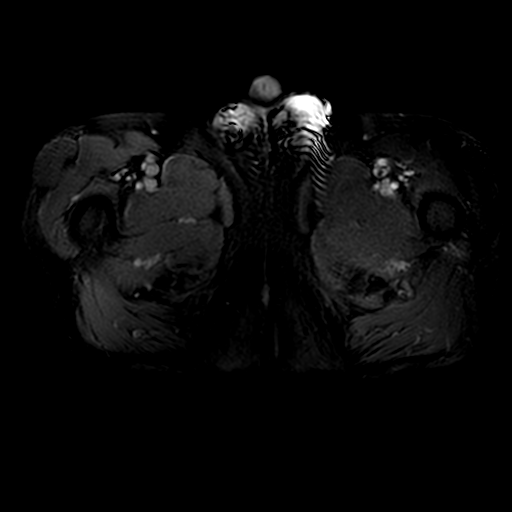

[Series 950: ADC · axial · 3.0mm · 0.70mm/px · 1 of 32 slices shown]
[im 1/32]
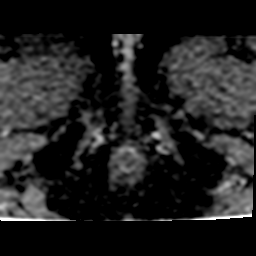

[Series 1100: T1 dynamic · axial · 4.0mm · 0.94mm/px · 1 of 26 slices shown (1 of 24)]
[im 1/26]
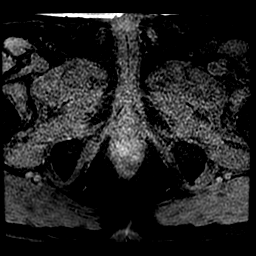

[Series 1101: T1 dynamic · axial · 4.0mm · 0.94mm/px · 1 of 26 slices shown (2 of 24)]
[im 1/26]
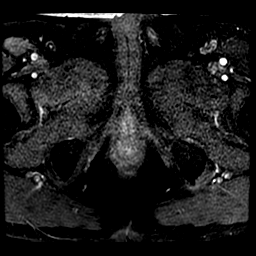

[Series 1102: T1 dynamic · axial · 4.0mm · 0.94mm/px · 1 of 26 slices shown (3 of 24)]
[im 1/26]
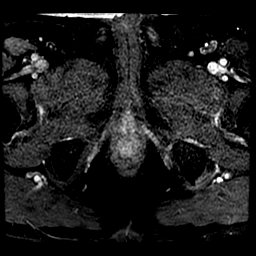

[Series 1103: T1 dynamic · axial · 4.0mm · 0.94mm/px · 1 of 26 slices shown (4 of 24)]
[im 1/26]
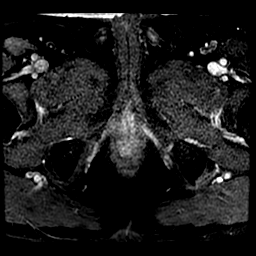

[Series 1104: T1 dynamic · axial · 4.0mm · 0.94mm/px · 1 of 26 slices shown (5 of 24)]
[im 1/26]
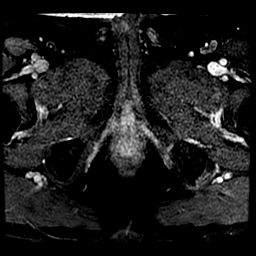

[Series 1105: T1 dynamic · axial · 4.0mm · 0.94mm/px · 1 of 26 slices shown (6 of 24)]
[im 1/26]
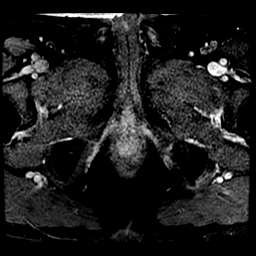

[Series 1106: T1 dynamic · axial · 4.0mm · 0.94mm/px · 1 of 26 slices shown (7 of 24)]
[im 1/26]
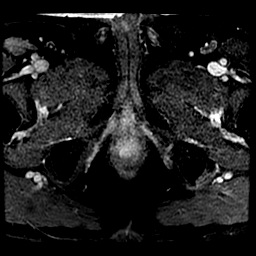

[Series 1107: T1 dynamic · axial · 4.0mm · 0.94mm/px · 1 of 26 slices shown (8 of 24)]
[im 1/26]
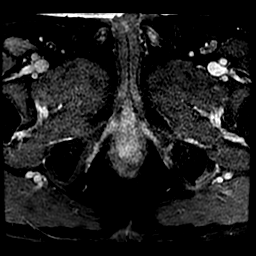

[Series 1108: T1 dynamic · axial · 4.0mm · 0.94mm/px · 1 of 26 slices shown (9 of 24)]
[im 1/26]
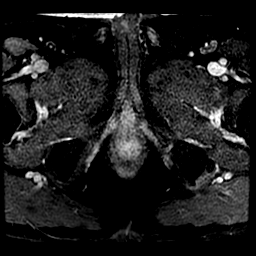

[Series 1109: T1 dynamic · axial · 4.0mm · 0.94mm/px · 1 of 26 slices shown (10 of 24)]
[im 1/26]
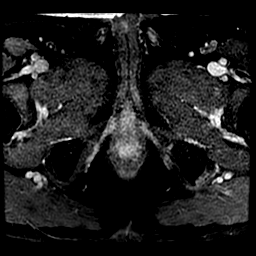

[Series 1110: T1 dynamic · axial · 4.0mm · 0.94mm/px · 1 of 26 slices shown (11 of 24)]
[im 1/26]
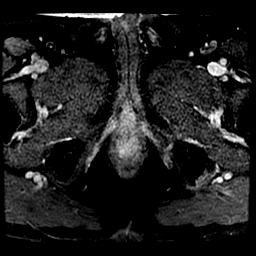

[Series 1111: T1 dynamic · axial · 4.0mm · 0.94mm/px · 1 of 26 slices shown (12 of 24)]
[im 1/26]
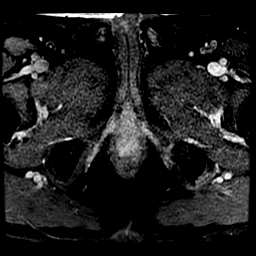

[Series 1112: T1 dynamic · axial · 4.0mm · 0.94mm/px · 1 of 26 slices shown (13 of 24)]
[im 1/26]
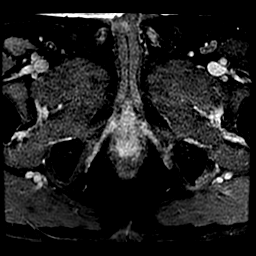

[Series 1113: T1 dynamic · axial · 4.0mm · 0.94mm/px · 1 of 26 slices shown (14 of 24)]
[im 1/26]
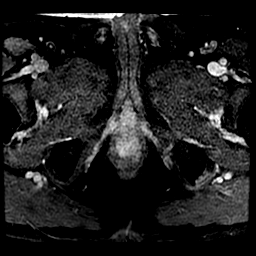

[Series 1114: T1 dynamic · axial · 4.0mm · 0.94mm/px · 1 of 26 slices shown (15 of 24)]
[im 1/26]
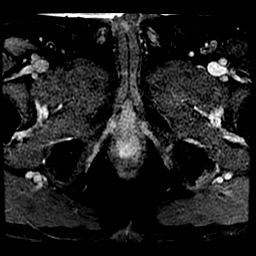

[Series 1115: T1 dynamic · axial · 4.0mm · 0.94mm/px · 1 of 26 slices shown (16 of 24)]
[im 1/26]
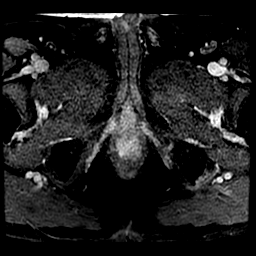

[Series 1116: T1 dynamic · axial · 4.0mm · 0.94mm/px · 1 of 26 slices shown (17 of 24)]
[im 1/26]
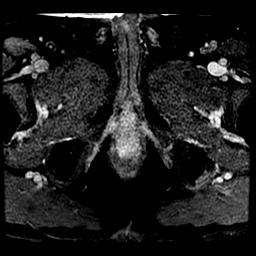

[Series 1117: T1 dynamic · axial · 4.0mm · 0.94mm/px · 1 of 26 slices shown (18 of 24)]
[im 1/26]
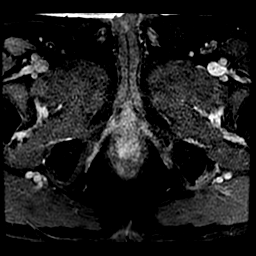

[Series 1118: T1 dynamic · axial · 4.0mm · 0.94mm/px · 1 of 26 slices shown (19 of 24)]
[im 1/26]
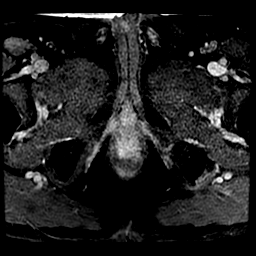

[Series 1119: T1 dynamic · axial · 4.0mm · 0.94mm/px · 1 of 26 slices shown (20 of 24)]
[im 1/26]
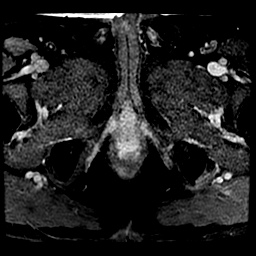

[Series 1120: T1 dynamic · axial · 4.0mm · 0.94mm/px · 1 of 26 slices shown (21 of 24)]
[im 1/26]
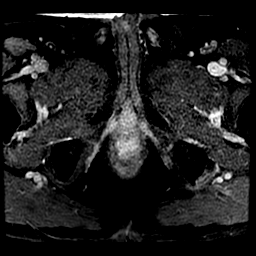

[Series 1121: T1 dynamic · axial · 4.0mm · 0.94mm/px · 1 of 26 slices shown (22 of 24)]
[im 1/26]
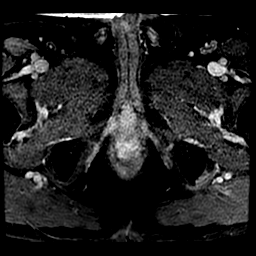

[Series 1122: T1 dynamic · axial · 4.0mm · 0.94mm/px · 1 of 26 slices shown (23 of 24)]
[im 1/26]
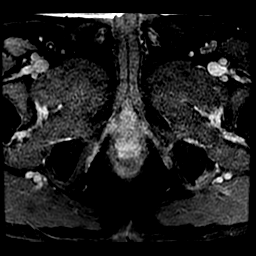

[Series 1123: T1 dynamic · axial · 4.0mm · 0.94mm/px · 1 of 26 slices shown (24 of 24)]
[im 1/26]
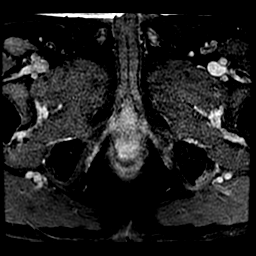

[((id)/(id)/1)-((id)/(id)/1) · axial · 4.0mm · 0.94mm/px · 1 of 16 slices shown (1 of 23)]
[im 1/16]
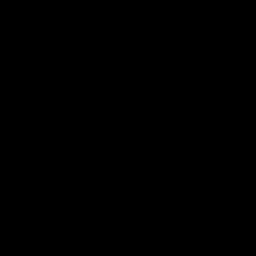

[((id)/(id)/1)-((id)/(id)/1) · axial · 4.0mm · 0.94mm/px · 1 of 26 slices shown (2 of 23)]
[im 1/26]
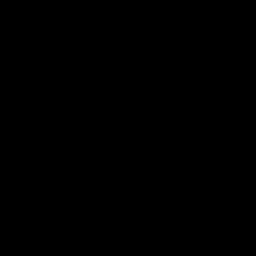

[((id)/(id)/1)-((id)/(id)/1) · axial · 4.0mm · 0.94mm/px · 1 of 25 slices shown (3 of 23)]
[im 1/25]
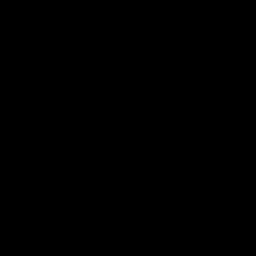

[((id)/(id)/1)-((id)/(id)/1) · axial · 4.0mm · 0.94mm/px · 1 of 26 slices shown (4 of 23)]
[im 1/26]
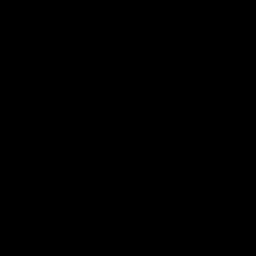

[((id)/(id)/1)-((id)/(id)/1) · axial · 4.0mm · 0.94mm/px · 1 of 26 slices shown (5 of 23)]
[im 1/26]
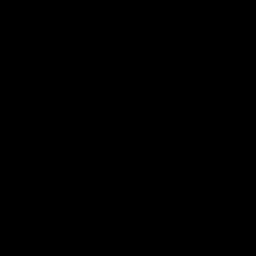

[((id)/(id)/1)-((id)/(id)/1) · axial · 4.0mm · 0.94mm/px · 1 of 26 slices shown (6 of 23)]
[im 1/26]
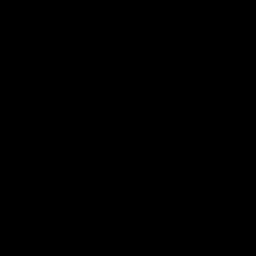

[((id)/(id)/1)-((id)/(id)/1) · axial · 4.0mm · 0.94mm/px · 1 of 26 slices shown (7 of 23)]
[im 1/26]
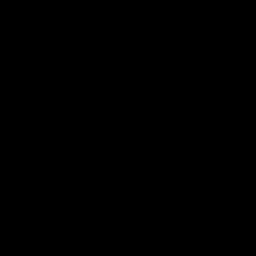

[((id)/(id)/1)-((id)/(id)/1) · axial · 4.0mm · 0.94mm/px · 1 of 26 slices shown (8 of 23)]
[im 1/26]
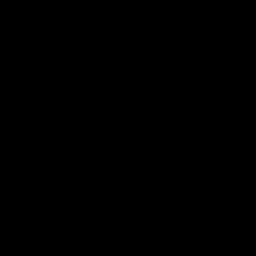

[((id)/(id)/1)-((id)/(id)/1) · axial · 4.0mm · 0.94mm/px · 1 of 26 slices shown (9 of 23)]
[im 1/26]
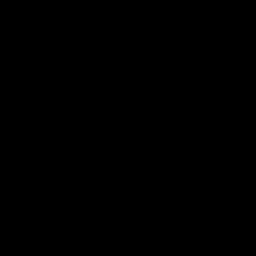

[((id)/(id)/1)-((id)/(id)/1) · axial · 4.0mm · 0.94mm/px · 1 of 26 slices shown (10 of 23)]
[im 1/26]
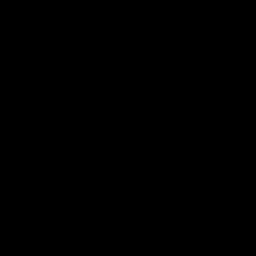

[((id)/(id)/1)-((id)/(id)/1) · axial · 4.0mm · 0.94mm/px · 1 of 26 slices shown (11 of 23)]
[im 1/26]
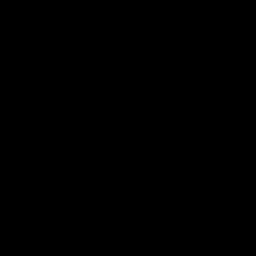

[((id)/(id)/1)-((id)/(id)/1) · axial · 4.0mm · 0.94mm/px · 1 of 26 slices shown (12 of 23)]
[im 1/26]
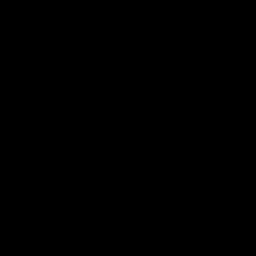

[((id)/(id)/1)-((id)/(id)/1) · axial · 4.0mm · 0.94mm/px · 1 of 26 slices shown (13 of 23)]
[im 1/26]
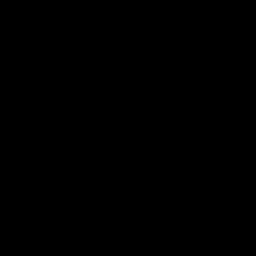

[((id)/(id)/1)-((id)/(id)/1) · axial · 4.0mm · 0.94mm/px · 1 of 26 slices shown (14 of 23)]
[im 1/26]
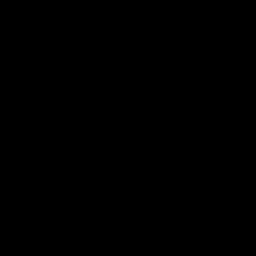

[((id)/(id)/1)-((id)/(id)/1) · axial · 4.0mm · 0.94mm/px · 1 of 26 slices shown (15 of 23)]
[im 1/26]
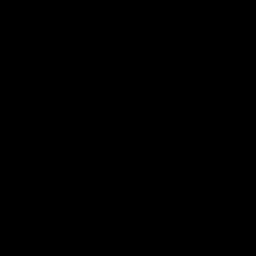

[((id)/(id)/1)-((id)/(id)/1) · axial · 4.0mm · 0.94mm/px · 1 of 26 slices shown (16 of 23)]
[im 1/26]
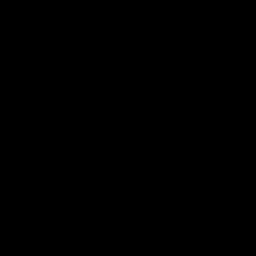

[((id)/(id)/1)-((id)/(id)/1) · axial · 4.0mm · 0.94mm/px · 1 of 26 slices shown (17 of 23)]
[im 1/26]
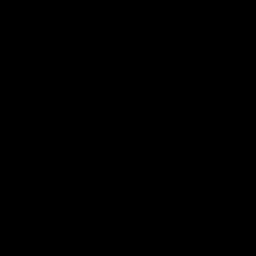

[((id)/(id)/1)-((id)/(id)/1) · axial · 4.0mm · 0.94mm/px · 1 of 26 slices shown (18 of 23)]
[im 1/26]
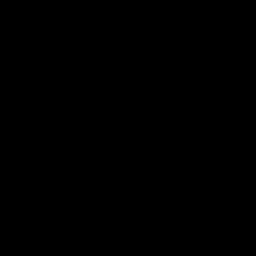

[((id)/(id)/1)-((id)/(id)/1) · axial · 4.0mm · 0.94mm/px · 1 of 26 slices shown (19 of 23)]
[im 1/26]
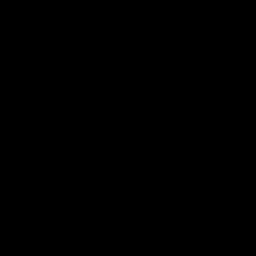

[((id)/(id)/1)-((id)/(id)/1) · axial · 4.0mm · 0.94mm/px · 1 of 26 slices shown (20 of 23)]
[im 1/26]
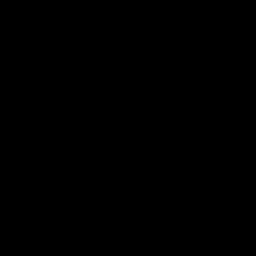

[((id)/(id)/1)-((id)/(id)/1) · axial · 4.0mm · 0.94mm/px · 1 of 26 slices shown (21 of 23)]
[im 1/26]
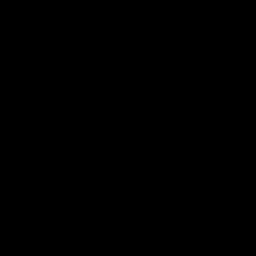

[((id)/(id)/1)-((id)/(id)/1) · axial · 4.0mm · 0.94mm/px · 1 of 26 slices shown (22 of 23)]
[im 1/26]
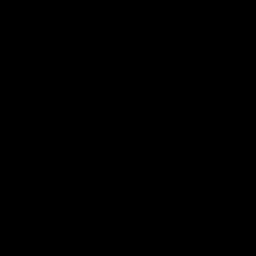

[((id)/(id)/1)-((id)/(id)/1) · axial · 4.0mm · 0.94mm/px · 1 of 26 slices shown (23 of 23)]
[im 1/26]
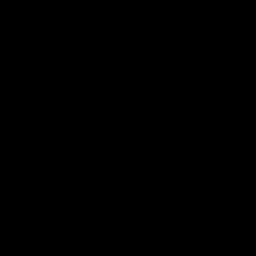

[55 of 55 positions shown; findings below may reference images not displayed]

FINDINGS: Prostate: PI-RADS category 5 lesion involving the anterior and
posterior peripheral zone the left mid gland, and adjacent to the
apex and base. In the apex is of although the posterolateral and
anterior segments. Associated hemorrhage exclusion side on T1
weighted images. No definite extracapsular extension.

Volume: 3D volumetric analysis: (4.5 by 2.6 by 1.3 cm)

Transcapsular spread:  Absent

Seminal vesicle involvement: Absent

Neurovascular bundle involvement: Absent

Pelvic adenopathy: Absent

Bone metastasis: Not demonstrated in the pelvis.

Other findings: Sigmoid colon diverticulosis. Small amount of free
pelvic fluid.
IMPRESSION: 1. PI-RADS category 5 lesion in the left gland peripheral zone
involving anterior and posterior segments in the mid gland and
adjacent base and apex. No definite extracapsular extension.
Associated hemorrhage exclusion sign noted.
2. No other definite lesions are observed. No findings of pelvic
adenopathy or definite pelvic bony metastatic lesion.
3. Other imaging findings of potential clinical significance: Small
free pelvic fluid. Sigmoid colon diverticulosis.
